# Patient Record
Sex: Male | Born: 1975
Health system: Southern US, Community
[De-identification: ages and names within clinical notes are randomized; demographics above are authoritative.]

## PROBLEM LIST (undated history)

## (undated) DIAGNOSIS — R519 Headache, unspecified: Secondary | ICD-10-CM

## (undated) DIAGNOSIS — R030 Elevated blood-pressure reading, without diagnosis of hypertension: Secondary | ICD-10-CM

## (undated) DIAGNOSIS — A159 Respiratory tuberculosis unspecified: Secondary | ICD-10-CM

## (undated) DIAGNOSIS — N476 Balanoposthitis: Secondary | ICD-10-CM

## (undated) DIAGNOSIS — I8390 Asymptomatic varicose veins of unspecified lower extremity: Secondary | ICD-10-CM

## (undated) DIAGNOSIS — M542 Cervicalgia: Secondary | ICD-10-CM

## (undated) DIAGNOSIS — K8020 Calculus of gallbladder without cholecystitis without obstruction: Secondary | ICD-10-CM

## (undated) DIAGNOSIS — M722 Plantar fascial fibromatosis: Secondary | ICD-10-CM

## (undated) DIAGNOSIS — M545 Low back pain, unspecified: Secondary | ICD-10-CM

## (undated) DIAGNOSIS — K602 Anal fissure, unspecified: Secondary | ICD-10-CM

## (undated) DIAGNOSIS — R229 Localized swelling, mass and lump, unspecified: Secondary | ICD-10-CM

## (undated) DIAGNOSIS — F4541 Pain disorder exclusively related to psychological factors: Secondary | ICD-10-CM

## (undated) DIAGNOSIS — R0609 Other forms of dyspnea: Secondary | ICD-10-CM

## (undated) DIAGNOSIS — G8929 Other chronic pain: Secondary | ICD-10-CM

## (undated) DIAGNOSIS — R51 Headache: Secondary | ICD-10-CM

## (undated) DIAGNOSIS — K219 Gastro-esophageal reflux disease without esophagitis: Secondary | ICD-10-CM

## (undated) DIAGNOSIS — S139XXA Sprain of joints and ligaments of unspecified parts of neck, initial encounter: Secondary | ICD-10-CM

## (undated) DIAGNOSIS — E669 Obesity, unspecified: Secondary | ICD-10-CM

## (undated) DIAGNOSIS — D649 Anemia, unspecified: Secondary | ICD-10-CM

## (undated) DIAGNOSIS — R0989 Other specified symptoms and signs involving the circulatory and respiratory systems: Secondary | ICD-10-CM

## (undated) DIAGNOSIS — R7989 Other specified abnormal findings of blood chemistry: Secondary | ICD-10-CM

## (undated) DIAGNOSIS — L210 Seborrhea capitis: Secondary | ICD-10-CM

## (undated) DIAGNOSIS — M1990 Unspecified osteoarthritis, unspecified site: Secondary | ICD-10-CM

## (undated) DIAGNOSIS — G4730 Sleep apnea, unspecified: Secondary | ICD-10-CM

## (undated) HISTORY — DX: Asymptomatic varicose veins of unspecified lower extremity: I83.90

## (undated) HISTORY — DX: Cervicalgia: M54.2

## (undated) HISTORY — DX: Other chronic pain: G89.29

## (undated) HISTORY — DX: Seborrhea capitis: L21.0

## (undated) HISTORY — DX: Sleep apnea, unspecified: G47.30

## (undated) HISTORY — DX: Localized swelling, mass and lump, unspecified: R22.9

## (undated) HISTORY — DX: Respiratory tuberculosis unspecified: A15.9

## (undated) HISTORY — DX: Other forms of dyspnea: R06.09

## (undated) HISTORY — DX: Anal fissure, unspecified: K60.2

## (undated) HISTORY — DX: Headache: R51

## (undated) HISTORY — DX: Low back pain: M54.5

## (undated) HISTORY — DX: Other specified symptoms and signs involving the circulatory and respiratory systems: R09.89

## (undated) HISTORY — DX: Elevated blood-pressure reading, without diagnosis of hypertension: R03.0

## (undated) HISTORY — DX: Other specified abnormal findings of blood chemistry: R79.89

## (undated) HISTORY — DX: Anemia, unspecified: D64.9

## (undated) HISTORY — DX: Obesity, unspecified: E66.9

## (undated) HISTORY — DX: Plantar fascial fibromatosis: M72.2

## (undated) HISTORY — DX: Unspecified osteoarthritis, unspecified site: M19.90

## (undated) HISTORY — DX: Gastro-esophageal reflux disease without esophagitis: K21.9

## (undated) HISTORY — DX: Sprain of joints and ligaments of unspecified parts of neck, initial encounter: S13.9XXA

## (undated) HISTORY — DX: Balanoposthitis: N47.6

## (undated) HISTORY — DX: Calculus of gallbladder without cholecystitis without obstruction: K80.20

## (undated) HISTORY — DX: Pain disorder exclusively related to psychological factors: F45.41

## (undated) HISTORY — DX: Headache, unspecified: R51.9

## (undated) HISTORY — DX: Low back pain, unspecified: M54.50

---

## 2009-06-23 HISTORY — PX: OTHER SURGICAL HISTORY: SHX169

## 2009-06-23 HISTORY — PX: HEMORRHOIDECTOMY WITH HEMORRHOID BANDING: SHX5633

## 2009-09-11 ENCOUNTER — Ambulatory Visit: Payer: Self-pay | Admitting: Family Medicine

## 2009-10-17 HISTORY — PX: GASTRIC BYPASS: SHX52

## 2010-02-07 ENCOUNTER — Ambulatory Visit: Payer: Self-pay | Admitting: Gastroenterology

## 2010-02-07 LAB — HM COLONOSCOPY: HM Colonoscopy: NORMAL

## 2012-05-11 ENCOUNTER — Encounter: Payer: Self-pay | Admitting: *Deleted

## 2012-05-18 ENCOUNTER — Ambulatory Visit: Payer: Self-pay | Admitting: Family Medicine

## 2012-05-26 ENCOUNTER — Ambulatory Visit: Payer: Self-pay | Admitting: Family Medicine

## 2012-09-23 ENCOUNTER — Ambulatory Visit (INDEPENDENT_AMBULATORY_CARE_PROVIDER_SITE_OTHER): Payer: 59 | Admitting: Internal Medicine

## 2012-09-23 ENCOUNTER — Encounter: Payer: Self-pay | Admitting: Internal Medicine

## 2012-09-23 VITALS — BP 118/68 | HR 71 | Temp 98.5°F | Resp 16 | Ht 76.0 in | Wt 247.5 lb

## 2012-09-23 DIAGNOSIS — R079 Chest pain, unspecified: Secondary | ICD-10-CM

## 2012-09-23 DIAGNOSIS — Z9884 Bariatric surgery status: Secondary | ICD-10-CM

## 2012-09-23 DIAGNOSIS — E785 Hyperlipidemia, unspecified: Secondary | ICD-10-CM

## 2012-09-23 DIAGNOSIS — E669 Obesity, unspecified: Secondary | ICD-10-CM

## 2012-09-23 DIAGNOSIS — R5381 Other malaise: Secondary | ICD-10-CM

## 2012-09-23 DIAGNOSIS — G8929 Other chronic pain: Secondary | ICD-10-CM

## 2012-09-23 DIAGNOSIS — R5383 Other fatigue: Secondary | ICD-10-CM

## 2012-09-23 DIAGNOSIS — R1013 Epigastric pain: Secondary | ICD-10-CM

## 2012-09-23 MED ORDER — ESOMEPRAZOLE MAGNESIUM 40 MG PO CPDR: 40.0000 mg | DELAYED_RELEASE_CAPSULE | Freq: Every day | ORAL | Status: DC

## 2012-09-23 NOTE — Progress Notes (Signed)
Patient ID: Chase Carey, male   DOB: 04-20-1976, 37 y.o.   MRN: [id number]            Patient Active Problem List  Diagnosis  . Chest pain, unspecified  . Obesity, unspecified    Subjective:  CC:   Chief Complaint  Patient presents with  . Establish Care    HPI:   Chase Carey is a 37 y.o. male who presents as a new patient to establish primary care with the chief complaint of  Recurrent  episode of morning substernal chest pain that spreadsa to abdoemn and frequently radiates around his sides to his back. The pain is sharp and feels like bad gas cramps.  It lasts for a full day, does not wax and wane and is typically  Resolved by next morning,  Has diminished appetite that day,  Pain is somehwat relieved by leaning back in chair but returns if he gets up.  The first occurrence was 1.5 yrs ago. He had gastric bypass in 2011 for morbid obesity at 389lbs.  He lost 150 lbs but has gained 11 lbs back. Symptoms are different than the dumping syndrome symptoms that he has expereinced if he overindulges in carbohydrates. .     Prior evaluation for pain complaint by a provider in [facility] was brief, did not include a GI or cardiac evaluation  and concluded/attributed pain to arthritis .   His CRF's include  very rare cigar smoking . FH unknown bc he is adopted. .  Lipid status unknown ,  Last checked 2 years ago.   He has occasional Leg cramps with driving,  And nocturnally, but not with exercise,   Prior to his gastric bypass he was tested and found to have mild sleep apnea.  His history of persistent knee and ankle joint pain havenow resolved with weight loss.   Rare episodes of back pain brought on by bending over.  Not more than twice a year. Stabbing pain .  Lasts  Usually a day or two,  No otc NSAIDs have helped rare  [place]PD in [address] treated with 6 months of INH     Past Medical History  Diagnosis Date  . Obesity, unspecified   . Anal fissure   . Asymptomatic varicose  veins   . Psychogenic pain, site unspecified   . Elevated blood pressure reading without diagnosis of hypertension   . Plantar fascial fibromatosis   . Seborrhea capitis   . Other dyspnea and respiratory abnormality   . Sprain of neck   . Cervicalgia   . Balanoposthitis   . Anemia, unspecified   . Other abnormal blood chemistry   . Lumbago   . Localized superficial swelling, mass, or lump   . Sleep apnea     mild    Past Surgical History  Procedure Laterality Date  . Gastric bypass  10/17/2009  . Rle varicose vein laser procedure  2011    [facility]  . Hemorrhoidectomy with hemorrhoid banding  2011    Family History  Problem Relation Age of Onset  . Adopted: Yes    History   Social History  . Marital Status: Married    Spouse Name: N/A    Number of Children: 2  . Years of Education: college   Occupational History  . [employer]    Social History Main Topics  . Smoking status: Never Smoker   . Smokeless tobacco: Not on file     Comment: smokes one cigar  per year  . Alcohol Use: Yes     Comment: occasional  1 - 2 times per year wine  . Drug Use: No  . Sexually Active: Not on file   Other Topics Concern  . Not on file   Social History Narrative   Unknown family history. Adopted. Married x 10.5 years, happily married. Caffeine use: Coffee, 2 servings per day. Guns in the home stored in locked cabinet, loaded. No formal exercise program.       @ALLHX @    Review of Systems:   The remainder of the review of systems was negative except those addressed in the HPI.       Objective:  BP 118/68  Pulse 71  Temp(Src) 98.5 F (36.9 C) (Oral)  Resp 16  Ht 6\' 4"  (1.93 m)  Wt 247 lb 8 oz (112.265 kg)  BMI 30.14 kg/m2  SpO2 98%  General appearance: alert, cooperative and appears stated age Ears: normal TM's and external ear canals both ears Throat: lips, mucosa, and tongue normal; teeth and gums normal Neck: no adenopathy, no carotid bruit,  supple, symmetrical, trachea midline and thyroid not enlarged, symmetric, no tenderness/mass/nodules Back: symmetric, no curvature. ROM normal. No CVA tenderness. Lungs: clear to auscultation bilaterally Heart: regular rate and rhythm, S1, S2 normal, no murmur, click, rub or gallop Abdomen: soft, non-tender; bowel sounds normal; no masses,  no organomegaly Pulses: 2+ and symmetric Skin: Skin color, texture, turgor normal. No rashes or lesions Lymph nodes: Cervical, supraclavicular, and axillary nodes normal.  Assessment and Plan:  Chest pain, unspecified His chest pain is atypical for cardiac causes and suggest more a GI etiology. However his cardiac risk factors are unknown at this time due to his adoptive status and unknown lipid status. His EKG is relatively normal with no signs of LVH or prior infarct. I have suggested that we try empiric treatment for esophageal reflux/gastritis with a a daily proton pump inhibitor and have him return during an episode of pain so that we can check an EKG and a lipase at the time the symptoms recur. He will also return for fasting lipids.  Obesity, unspecified Patient maintains his weight currently however he has gained back 11 pounds since his surgery. Low carbohydrate diet and exercise recommended. Continue supplements.   Updated Medication List Outpatient Encounter Prescriptions as of 09/23/2012  Medication Sig Dispense Refill  . calcium carbonate (OS-CAL) 600 MG TABS Take 600 mg by mouth 2 (two) times daily with a meal.      . cholecalciferol (VITAMIN D) 1000 UNITS tablet Take 3,000 Units by mouth daily.       . clobetasol (OLUX) 0.05 % topical foam Apply topically 2 (two) times daily.      . Multiple Vitamin (MULTIVITAMINS PO) Take by mouth daily.      . vitamin B-12 (CYANOCOBALAMIN) 100 MCG tablet Take 100 mcg by mouth daily.      Marland Kitchen EPINEPHrine (EPIPEN 2-PAK) 0.3 mg/0.3 mL DEVI Inject 0.3 mg into the muscle once.      Marland Kitchen esomeprazole (NEXIUM) 40 MG  capsule Take 1 capsule (40 mg total) by mouth daily.  30 capsule  3  . nystatin cream (MYCOSTATIN) Apply topically 2 (two) times daily.       No facility-administered encounter medications on file as of 09/23/2012.     Orders Placed This Encounter  Procedures  . COMPLETE METABOLIC PANEL WITH GFR  . CBC with Differential  . Vitamin B12  . Magnesium  . Lipid  panel  . Hemoglobin A1c  . Lipase  . EKG 12-Lead    No Follow-up on file.

## 2012-09-23 NOTE — Patient Instructions (Addendum)
I think your epiosdes of  abdominal and chest pain may be coming from severe reflux,  Gastritis or pancreatitis  I am starting you on a proton pump inhibitor to see if it helps.  Take it in the am 30 minutes prior to eating.  Please request records from whatever doctor did your preoperative evaluation with EKG.  Please call us if you have another episode so we can see you same day  Return  Tomorrow for fasting bloodwork (only black coffee or  water )

## 2012-09-24 ENCOUNTER — Other Ambulatory Visit (INDEPENDENT_AMBULATORY_CARE_PROVIDER_SITE_OTHER): Payer: 59

## 2012-09-24 DIAGNOSIS — G8929 Other chronic pain: Secondary | ICD-10-CM

## 2012-09-24 DIAGNOSIS — E663 Overweight: Secondary | ICD-10-CM | POA: Insufficient documentation

## 2012-09-24 DIAGNOSIS — R1013 Epigastric pain: Secondary | ICD-10-CM

## 2012-09-24 DIAGNOSIS — R079 Chest pain, unspecified: Secondary | ICD-10-CM | POA: Insufficient documentation

## 2012-09-24 DIAGNOSIS — E669 Obesity, unspecified: Secondary | ICD-10-CM | POA: Insufficient documentation

## 2012-09-24 DIAGNOSIS — Z9884 Bariatric surgery status: Secondary | ICD-10-CM

## 2012-09-24 LAB — LIPID PANEL
Cholesterol: 172 mg/dL (ref 0–200)
HDL: 56.1 mg/dL (ref >39.00)
LDL Cholesterol: 106 mg/dL — ABNORMAL HIGH (ref 0–99)
Triglycerides: 48 mg/dL (ref 0.0–149.0)
VLDL: 9.6 mg/dL (ref 0.0–40.0)

## 2012-09-24 LAB — CBC WITH DIFFERENTIAL/PLATELET
Basophils Relative: 0.7 % (ref 0.0–3.0)
Eosinophils Absolute: 0.1 10*3/uL (ref 0.0–0.7)
Eosinophils Relative: 2.6 % (ref 0.0–5.0)
Hemoglobin: 12.4 g/dL — ABNORMAL LOW (ref 13.0–17.0)
Lymphocytes Relative: 27.5 % (ref 12.0–46.0)
MCHC: 33 g/dL (ref 30.0–36.0)
Monocytes Relative: 9 % (ref 3.0–12.0)
Neutro Abs: 3 10*3/uL (ref 1.4–7.7)
Neutrophils Relative %: 60.2 % (ref 43.0–77.0)
RBC: 3.85 Mil/uL — ABNORMAL LOW (ref 4.22–5.81)
WBC: 4.9 10*3/uL (ref 4.5–10.5)

## 2012-09-24 LAB — MAGNESIUM: Magnesium: 1.8 mg/dL (ref 1.5–2.5)

## 2012-09-24 LAB — LIPASE: Lipase: 22 U/L (ref 11.0–59.0)

## 2012-09-24 NOTE — Assessment & Plan Note (Signed)
Patient maintains his weight currently however he has gained back 11 pounds since his surgery. Low carbohydrate diet and exercise recommended. Continue supplements.

## 2012-09-24 NOTE — Assessment & Plan Note (Addendum)
His chest pain is atypical for cardiac causes and suggest more a GI etiology. However his cardiac risk factors are unknown at this time due to his adoptive status and unknown lipid status. His EKG is relatively normal with no signs of LVH or prior infarct. I have suggested that we try empiric treatment for esophageal reflux/gastritis with a a daily proton pump inhibitor and have him return during an episode of pain so that we can check an EKG and a lipase at the time the symptoms recur. He will also return for fasting lipids.

## 2012-09-25 LAB — COMPLETE METABOLIC PANEL WITH GFR
ALT: 13 U/L (ref 0–53)
BUN: 14 mg/dL (ref 6–23)
CO2: 29 mEq/L (ref 19–32)
Calcium: 9.2 mg/dL (ref 8.4–10.5)
Creat: 0.9 mg/dL (ref 0.50–1.35)
GFR, Est African American: >89 mL/min
Total Bilirubin: 1 mg/dL (ref 0.3–1.2)

## 2012-09-27 ENCOUNTER — Encounter: Payer: Self-pay | Admitting: General Practice

## 2012-09-27 NOTE — Progress Notes (Signed)
Letter mailed to pt.  

## 2012-09-29 ENCOUNTER — Telehealth: Payer: Self-pay | Admitting: Internal Medicine

## 2012-09-29 MED ORDER — ESOMEPRAZOLE MAGNESIUM 40 MG PO CPDR: 40.0000 mg | DELAYED_RELEASE_CAPSULE | Freq: Every day | ORAL | Status: DC

## 2012-09-29 NOTE — Telephone Encounter (Signed)
Left message on patients voicemail to call office , RX sent to pharmacy.

## 2012-09-29 NOTE — Telephone Encounter (Signed)
Patient wanting his   (NEXIUM) 40 MG capsule  Prescription sent to [facility] [address].

## 2012-10-29 ENCOUNTER — Ambulatory Visit (INDEPENDENT_AMBULATORY_CARE_PROVIDER_SITE_OTHER): Payer: 59 | Admitting: Internal Medicine

## 2012-10-29 ENCOUNTER — Encounter: Payer: Self-pay | Admitting: Internal Medicine

## 2012-10-29 VITALS — BP 146/76 | HR 79 | Temp 98.0°F | Resp 16 | Wt 250.5 lb

## 2012-10-29 DIAGNOSIS — K219 Gastro-esophageal reflux disease without esophagitis: Secondary | ICD-10-CM | POA: Insufficient documentation

## 2012-10-29 DIAGNOSIS — Z9852 Vasectomy status: Secondary | ICD-10-CM | POA: Insufficient documentation

## 2012-10-29 DIAGNOSIS — R079 Chest pain, unspecified: Secondary | ICD-10-CM

## 2012-10-29 DIAGNOSIS — E669 Obesity, unspecified: Secondary | ICD-10-CM

## 2012-10-29 DIAGNOSIS — Z3009 Encounter for other general counseling and advice on contraception: Secondary | ICD-10-CM

## 2012-10-29 NOTE — Progress Notes (Signed)
Patient ID: Chase Carey, male   DOB: 19-Jul-1975, 37 y.o.   MRN: [id number]  Patient Active Problem List   Diagnosis Date Noted  . Esophageal reflux 10/29/2012  . Encounter for vasectomy counseling 10/29/2012  . Chest pain, unspecified 09/24/2012  . Obesity, unspecified 09/24/2012    Subjective:  CC:   Chief Complaint  Patient presents with  . Follow-up    vasectomy to  consult    HPI:   Chase Carey a 37 y.o. male who presents  For follow up on recurrent  atypical substernal chest pain reported at first visit one month ago.  And to discuss referral for vasectomy.   His personal risk factors were assessed for early CAD and were minimal but his FH is unknown due to adopted status.  He was empirically treated for reflux prescribed one month ago., And the pain has only recurred once and not nearly as bad .  The pain started at work in the xiphoid area , was heavy and sharp, and spread to enitre abdomen and back before resolving spontaneously .   Past Medical History  Diagnosis Date  . Obesity, unspecified   . Anal fissure   . Asymptomatic varicose veins   . Psychogenic pain, site unspecified   . Elevated blood pressure reading without diagnosis of hypertension   . Plantar fascial fibromatosis   . Seborrhea capitis   . Other dyspnea and respiratory abnormality   . Sprain of neck   . Cervicalgia   . Balanoposthitis   . Anemia, unspecified   . Other abnormal blood chemistry   . Lumbago   . Localized superficial swelling, mass, or lump   . Sleep apnea     mild    Past Surgical History  Procedure Laterality Date  . Gastric bypass  10/17/2009  . Rle varicose vein laser procedure  2011    [facility]  . Hemorrhoidectomy with hemorrhoid banding  2011       The following portions of the patient's history were reviewed and updated as appropriate: Allergies, current medications, and problem list.    Review of Systems:  Patient denies headache, fevers, malaise,  unintentional weight loss, skin rash, eye pain, sinus congestion and sinus pain, sore throat, dysphagia,  hemoptysis , cough, dyspnea, wheezing, chest pain, palpitations, orthopnea, edema, abdominal pain, nausea, melena, diarrhea, constipation, flank pain, dysuria, hematuria, urinary  Frequency, nocturia, numbness, tingling, seizures,  Focal weakness, Loss of consciousness,  Tremor, insomnia, depression, anxiety, and suicidal ideation.     History   Social History  . Marital Status: Married    Spouse Name: N/A    Number of Children: 2  . Years of Education: college   Occupational History  . [employer]    Social History Main Topics  . Smoking status: Never Smoker   . Smokeless tobacco: Not on file     Comment: smokes one cigar per year  . Alcohol Use: Yes     Comment: occasional  1 - 2 times per year wine  . Drug Use: No  . Sexually Active: Not on file   Other Topics Concern  . Not on file   Social History Narrative   Unknown family history. Adopted. Married x 10.5 years, happily married. Caffeine use: Coffee, 2 servings per day. Guns in the home stored in locked cabinet, loaded. No formal exercise program.    Objective:  BP 146/76  Pulse 79  Temp(Src) 98 F (36.7 C) (Oral)  Resp 16  Wt 250 lb  8 oz (113.626 kg)  BMI 30.5 kg/m2  SpO2 99%  General appearance: alert, cooperative and appears stated age Ears: normal TM's and external ear canals both ears Throat: lips, mucosa, and tongue normal; teeth and gums normal Neck: no adenopathy, no carotid bruit, supple, symmetrical, trachea midline and thyroid not enlarged, symmetric, no tenderness/mass/nodules Back: symmetric, no curvature. ROM normal. No CVA tenderness. Lungs: clear to auscultation bilaterally Heart: regular rate and rhythm, S1, S2 normal, no murmur, click, rub or gallop Abdomen: soft, non-tender; bowel sounds normal; no masses,  no organomegaly Pulses: 2+ and symmetric Skin: Skin color, texture, turgor  normal. No rashes or lesions Lymph nodes: Cervical, supraclavicular, and axillary nodes normal.  Assessment and Plan:  Esophageal reflux Presumed,  Improved with use of nexium  Chest pain, unspecified Atypical ,  Occurring at rest, with no known modifiable risk factors.  Continue PPI.   Obesity, unspecified He has gained a few lbs back after losing 150 lbs.  I have addressed  BMI and recommended a low glycemic index diet utilizing smaller more frequent meals to increase metabolism.  I have also recommended that patient start exercising with a goal of 30 minutes of aerobic exercise a minimum of 5 days per week. Screening for lipid disorders, thyroid and diabetes to be done today.    Encounter for vasectomy counseling He is requesting vasectomy for permanent contraception .  Referral to Urology   Updated Medication List Outpatient Encounter Prescriptions as of 10/29/2012  Medication Sig Dispense Refill  . calcium carbonate (OS-CAL) 600 MG TABS Take 600 mg by mouth 2 (two) times daily with a meal.      . cholecalciferol (VITAMIN D) 1000 UNITS tablet Take 3,000 Units by mouth daily.       . clobetasol (OLUX) 0.05 % topical foam Apply topically 2 (two) times daily.      Marland Kitchen EPINEPHrine (EPIPEN 2-PAK) 0.3 mg/0.3 mL DEVI Inject 0.3 mg into the muscle once.      Marland Kitchen esomeprazole (NEXIUM) 40 MG capsule Take 1 capsule (40 mg total) by mouth daily.  30 capsule  3  . Multiple Vitamin (MULTIVITAMINS PO) Take by mouth daily.      Marland Kitchen nystatin cream (MYCOSTATIN) Apply topically 2 (two) times daily.      . vitamin B-12 (CYANOCOBALAMIN) 100 MCG tablet Take 100 mcg by mouth daily.       No facility-administered encounter medications on file as of 10/29/2012.     Orders Placed This Encounter  Procedures  . Ambulatory referral to Urology    No Follow-up on file.

## 2012-10-29 NOTE — Assessment & Plan Note (Signed)
Presumed,  Improved with use of nexium

## 2012-10-31 ENCOUNTER — Encounter: Payer: Self-pay | Admitting: Internal Medicine

## 2012-10-31 NOTE — Assessment & Plan Note (Signed)
He is requesting vasectomy for permanent contraception .  Referral to Urology

## 2012-10-31 NOTE — Assessment & Plan Note (Signed)
Atypical ,  Occurring at rest, with no known modifiable risk factors.  Continue PPI.

## 2012-10-31 NOTE — Assessment & Plan Note (Signed)
He has gained a few lbs back after losing 150 lbs.  I have addressed  BMI and recommended a low glycemic index diet utilizing smaller more frequent meals to increase metabolism.  I have also recommended that patient start exercising with a goal of 30 minutes of aerobic exercise a minimum of 5 days per week. Screening for lipid disorders, thyroid and diabetes to be done today.

## 2013-01-10 ENCOUNTER — Telehealth: Payer: Self-pay | Admitting: Internal Medicine

## 2013-01-10 DIAGNOSIS — I83893 Varicose veins of bilateral lower extremities with other complications: Secondary | ICD-10-CM

## 2013-01-10 NOTE — Telephone Encounter (Signed)
Referral is in process as requested 

## 2013-01-10 NOTE — Telephone Encounter (Signed)
Patient verified that bleeding has stopped and that he has small scratch or puncture where bleeding occurred. Please advise.

## 2013-01-10 NOTE — Telephone Encounter (Signed)
Pt asking for referral to vein specialist for his varicose veins.  Pt states on Thursday he noticed bleeding while in the shower that lasted approximately 2 minutes.

## 2013-02-07 ENCOUNTER — Encounter: Payer: Self-pay | Admitting: Internal Medicine

## 2013-02-25 HISTORY — PX: VASECTOMY: SHX75

## 2013-02-28 ENCOUNTER — Encounter: Payer: 59 | Admitting: Internal Medicine

## 2013-04-07 HISTORY — PX: VEIN LIGATION AND STRIPPING: SHX2653

## 2013-04-11 ENCOUNTER — Ambulatory Visit (INDEPENDENT_AMBULATORY_CARE_PROVIDER_SITE_OTHER): Payer: 59 | Admitting: Internal Medicine

## 2013-04-11 ENCOUNTER — Encounter: Payer: Self-pay | Admitting: Internal Medicine

## 2013-04-11 VITALS — BP 118/72 | HR 89 | Temp 97.9°F | Resp 14 | Ht 76.0 in | Wt 243.5 lb

## 2013-04-11 DIAGNOSIS — Z9884 Bariatric surgery status: Secondary | ICD-10-CM

## 2013-04-11 DIAGNOSIS — Z3009 Encounter for other general counseling and advice on contraception: Secondary | ICD-10-CM

## 2013-04-11 DIAGNOSIS — E669 Obesity, unspecified: Secondary | ICD-10-CM

## 2013-04-11 DIAGNOSIS — K8020 Calculus of gallbladder without cholecystitis without obstruction: Secondary | ICD-10-CM

## 2013-04-11 DIAGNOSIS — R1011 Right upper quadrant pain: Secondary | ICD-10-CM

## 2013-04-11 DIAGNOSIS — Z0000 Encounter for general adult medical examination without abnormal findings: Secondary | ICD-10-CM

## 2013-04-11 DIAGNOSIS — R1013 Epigastric pain: Secondary | ICD-10-CM

## 2013-04-11 DIAGNOSIS — I8393 Asymptomatic varicose veins of bilateral lower extremities: Secondary | ICD-10-CM | POA: Insufficient documentation

## 2013-04-11 DIAGNOSIS — I83819 Varicose veins of unspecified lower extremities with pain: Secondary | ICD-10-CM | POA: Insufficient documentation

## 2013-04-11 DIAGNOSIS — I83893 Varicose veins of bilateral lower extremities with other complications: Secondary | ICD-10-CM

## 2013-04-11 NOTE — Patient Instructions (Signed)
I am ordering an ultrasound of your gallbladder to check for gallstones.  The bloodwork today will include some vitamins known to drop after bariatric syrgery

## 2013-04-11 NOTE — Assessment & Plan Note (Signed)
Done Sept 5th by Dr Isabel Caprice.  First check was zero.  tenderness resolved.

## 2013-04-12 ENCOUNTER — Encounter: Payer: Self-pay | Admitting: Internal Medicine

## 2013-04-12 ENCOUNTER — Ambulatory Visit: Payer: Self-pay | Admitting: Internal Medicine

## 2013-04-12 DIAGNOSIS — Z0000 Encounter for general adult medical examination without abnormal findings: Secondary | ICD-10-CM | POA: Insufficient documentation

## 2013-04-12 LAB — LIPASE: Lipase: 33 U/L (ref 11.0–59.0)

## 2013-04-12 LAB — COMPREHENSIVE METABOLIC PANEL
Alkaline Phosphatase: 55 U/L (ref 39–117)
BUN: 18 mg/dL (ref 6–23)
Glucose, Bld: 77 mg/dL (ref 70–99)
Total Bilirubin: 0.9 mg/dL (ref 0.3–1.2)

## 2013-04-12 NOTE — Assessment & Plan Note (Signed)
His weight has continued to decline slowly since his bariatric surgery in 2011.  Labs today. Low GI diet and regular exercise encouraged.

## 2013-04-12 NOTE — Progress Notes (Signed)
Patient ID: Chase Carey, male   DOB: 08-24-1975, 37 y.o.   MRN: [id number]  The patient is here for his annual male physical examination and management of other chronic and acute problems.  He underwent elective vasectomy in August ,  With no complications.  His inguinal tenderness resolved about a week ago.    The risk factors are reflected in the social history.  The roster of all physicians providing medical care to patient - is listed in the Snapshot section of the chart.  Activities of daily living:  The patient is 100% independent in all ADLs: dressing, toileting, feeding as well as independent mobility  Home safety : The patient has smoke detectors in the home. He wears seatbelts.  There are no firearms at home. There is no violence in the home.   There is no risks for hepatitis, STDs or HIV. There is no   history of blood transfusion. There is no travel history to infectious disease endemic areas of the world.  The patient has seen their dentist in the last six month and  their eye doctor in the last year.  They do not  have excessive sun exposure. They have seen a dermatoloigist in the last year. Discussed the need for sun protection: hats, long sleeves and use of sunscreen if there is significant sun exposure.   Diet: the importance of a healthy diet is discussed. They do have a healthy diet.  The benefits of regular aerobic exercise were discussed. He exercises a minimum of 30 minutes  5 days per week. Depression screen: there are no signs or vegative symptoms of depression- irritability, change in appetite, anhedonia, sadness/tearfullness.  The following portions of the patient's history were reviewed and updated as appropriate: allergies, current medications, past family history, past medical history,  past surgical history, past social history  and problem list.  Visual acuity was not assessed per patient preference since he has regular follow up with his ophthalmologist. Hearing  and body mass index were assessed and reviewed.   During the course of the visit the patient was educated and counseled about appropriate screening and preventive services including :  nutrition counseling, colorectal cancer screening, and recommended immunizations.   Objective:  BP 118/72  Pulse 89  Temp(Src) 97.9 F (36.6 C) (Oral)  Resp 14  Ht 6\' 4"  (1.93 m)  Wt 243 lb 8 oz (110.451 kg)  BMI 29.65 kg/m2  SpO2 99%  General Appearance:    Alert, cooperative, no distress, appears stated age  Head:    Normocephalic, without obvious abnormality, atraumatic  Eyes:    PERRL, conjunctiva/corneas clear, EOM's intact, fundi    benign, both eyes       Ears:    Normal TM's and external ear canals, both ears  Nose:   Nares normal, septum midline, mucosa normal, no drainage   or sinus tenderness  Throat:   Lips, mucosa, and tongue normal; teeth and gums normal  Neck:   Supple, symmetrical, trachea midline, no adenopathy;       thyroid:  No enlargement/tenderness/nodules; no carotid   bruit or JVD  Back:     Symmetric, no curvature, ROM normal, no CVA tenderness  Lungs:     Clear to auscultation bilaterally, respirations unlabored  Chest wall:    No tenderness or deformity  Heart:    Regular rate and rhythm, S1 and S2 normal, no murmur, rub   or gallop  Abdomen:     Soft, non-tender, bowel sounds active  all four quadrants,    no masses, no organomegaly  Genitalia:    Normal male without lesion, discharge or tenderness  Rectal:    Normal tone, normal prostate, no masses or tenderness;   guaiac negative stool  Extremities:   Extremities normal, atraumatic, no cyanosis or edema  Pulses:   2+ and symmetric all extremities  Skin:   Skin color, texture, turgor normal, no rashes or lesions  Lymph nodes:   Cervical, supraclavicular, and axillary nodes normal  Neurologic:   CNII-XII intact. Normal strength, sensation and reflexes      throughout   Assessment and Plan:  Encounter for  vasectomy counseling Done Sept 5th by Dr Isabel Caprice.  First check was zero.  tenderness resolved.   Varicose veins of lower extremities with other complications Right leg s/[p ablation by [facility] last Friday.,  Some pain but no redness.    Wearing his Ted hose.    Obesity, unspecified His weight has continued to decline slowly since his bariatric surgery in 2011.  Labs today. Low GI diet and regular exercise encouraged.   Routine general medical examination at a health care facility Annual physical exam was done excluding testicular exam since he has vasectomy 8 weeks ago.  Screening for STDS and depressive symptoms was negative. Vaccines were brought up to date.  Healthy habits were reviewed including use of seatbelts 100% of the time , moderate alcohol consumption, regular exercise, and the  mediterranean diet.    Updated Medication List Outpatient Encounter Prescriptions as of 04/11/2013  Medication Sig Dispense Refill  . calcium carbonate (OS-CAL) 600 MG TABS Take 600 mg by mouth 2 (two) times daily with a meal.      . cholecalciferol (VITAMIN D) 1000 UNITS tablet Take 3,000 Units by mouth daily.       . clobetasol (OLUX) 0.05 % topical foam Apply topically 2 (two) times daily.      Marland Kitchen EPINEPHrine (EPIPEN 2-PAK) 0.3 mg/0.3 mL DEVI Inject 0.3 mg into the muscle once.      Marland Kitchen esomeprazole (NEXIUM) 40 MG capsule Take 1 capsule (40 mg total) by mouth daily.  30 capsule  3  . Multiple Vitamin (MULTIVITAMINS PO) Take by mouth daily.      . vitamin B-12 (CYANOCOBALAMIN) 100 MCG tablet Take 100 mcg by mouth daily.      Marland Kitchen nystatin cream (MYCOSTATIN) Apply topically 2 (two) times daily.       No facility-administered encounter medications on file as of 04/11/2013.

## 2013-04-12 NOTE — Assessment & Plan Note (Signed)
Right leg s/[p ablation by [facility] last Friday.,  Some pain but no redness.    Wearing his Ted hose.

## 2013-04-12 NOTE — Assessment & Plan Note (Signed)
Annual physical exam was done excluding testicular exam since he has vasectomy 8 weeks ago.  Screening for STDS and depressive symptoms was negative. Vaccines were brought up to date.  Healthy habits were reviewed including use of seatbelts 100% of the time , moderate alcohol consumption, regular exercise, and the  mediterranean diet.

## 2013-04-14 NOTE — Addendum Note (Signed)
Addended by: Sherlene Shams on: 04/14/2013 01:36 PM   Modules accepted: Orders

## 2013-04-15 LAB — VITAMIN A: Vitamin A (Retinoic Acid): 48 ug/dL (ref 38–98)

## 2013-04-15 LAB — ZINC: Zinc: 53 ug/dL — ABNORMAL LOW (ref 60–130)

## 2013-04-15 LAB — VITAMIN E: Vitamin E (Alpha Tocopherol): 13.3 mg/L (ref 5.7–19.9)

## 2013-04-20 LAB — VITAMIN K1, SERUM

## 2013-04-22 ENCOUNTER — Ambulatory Visit: Payer: Self-pay | Admitting: Internal Medicine

## 2013-04-25 ENCOUNTER — Telehealth: Payer: Self-pay | Admitting: Internal Medicine

## 2013-04-25 DIAGNOSIS — R1013 Epigastric pain: Secondary | ICD-10-CM

## 2013-04-25 NOTE — Telephone Encounter (Signed)
His HIDA scan was normal, which means his gallbladder if sunctioning fine. Marland Kitchen  He will need to see a GI doctor for further evaluation of his abdominal pain ; does he have a preference?

## 2013-04-27 ENCOUNTER — Encounter: Payer: Self-pay | Admitting: Internal Medicine

## 2013-04-27 NOTE — Telephone Encounter (Signed)
Called and advised patient of results. Prefers a [facility] GI doctor. Advised Amber will call with an appointment after referral placed.

## 2013-05-03 ENCOUNTER — Encounter: Payer: Self-pay | Admitting: Internal Medicine

## 2013-05-03 ENCOUNTER — Ambulatory Visit: Payer: 59 | Admitting: Internal Medicine

## 2013-05-06 ENCOUNTER — Encounter: Payer: Self-pay | Admitting: Internal Medicine

## 2013-05-25 ENCOUNTER — Encounter: Payer: Self-pay | Admitting: Internal Medicine

## 2013-05-26 ENCOUNTER — Ambulatory Visit: Payer: 59 | Admitting: Internal Medicine

## 2013-07-14 ENCOUNTER — Encounter: Payer: Self-pay | Admitting: Internal Medicine

## 2013-07-18 ENCOUNTER — Ambulatory Visit (INDEPENDENT_AMBULATORY_CARE_PROVIDER_SITE_OTHER): Payer: 59 | Admitting: Internal Medicine

## 2013-07-18 ENCOUNTER — Encounter: Payer: Self-pay | Admitting: Internal Medicine

## 2013-07-18 VITALS — BP 110/70 | HR 60 | Ht 76.0 in | Wt 243.8 lb

## 2013-07-18 DIAGNOSIS — R142 Eructation: Secondary | ICD-10-CM

## 2013-07-18 DIAGNOSIS — K8020 Calculus of gallbladder without cholecystitis without obstruction: Secondary | ICD-10-CM

## 2013-07-18 DIAGNOSIS — R1013 Epigastric pain: Secondary | ICD-10-CM

## 2013-07-18 DIAGNOSIS — R140 Abdominal distension (gaseous): Secondary | ICD-10-CM

## 2013-07-18 DIAGNOSIS — R141 Gas pain: Secondary | ICD-10-CM

## 2013-07-18 DIAGNOSIS — R143 Flatulence: Secondary | ICD-10-CM

## 2013-07-18 MED ORDER — HYOSCYAMINE SULFATE 0.125 MG SL SUBL: 0.1250 mg | SUBLINGUAL_TABLET | SUBLINGUAL | Status: DC | PRN

## 2013-07-18 NOTE — Progress Notes (Signed)
Patient ID: Chase Carey, male   DOB: 01-05-1976, 38 y.o.   MRN: [id number] HPI: Chase Carey is a 38 yo male with PMH of mild sleep apnea, chronic headaches, gallstones, elevated blood pressure he was seen in consultation at the request of Dr. Darrick Huntsman to evaluate episodic upper abd pain.  He is here alone today.  The patient reports episodes of upper abdominal pain which radiates to his back over the last 3 years. He reports that this has been significant for the past 12 months, but he first noticed it about 3 years ago. He reports this is episodic in nature and when it occurs he reports it as a burning bilateral upper abdominal pain which radiates straight through to his back. He reports it feels like a "very bad gas pain". When it occurs symptoms tend to last for approximately 1 day and then resolve completely. He reports the pain usually starts in the morning, but is not present when he wakes up. He describes it times as a abdominal bloating which she thinks would improve with belching but it never seems to. He has taken Gas-X for the bloating which helps some.  He tried Nexium 40 mg daily for 2-3 months without benefit. Occasionally he does have nausea associated with the pain and very rarely vomiting. Frequency of episodes tend to be approximately once per week but he can go over a month between episodes. He does not think it worsens with eating in any way. No change in bowel movements or bowel habit. He reports a history of hemorrhoids and painless rectal bleeding but this is not a problem of late. He reports a colonoscopy in 2011 performed in [place] at [facility]. He reports this was normal except for hemorrhoids.  He did have an abdominal ultrasound and HIDA scan performed in [place] in the last 6 months.  He was told he has gallstones.  Past Medical History  Diagnosis Date  . Obesity, unspecified   . Anal fissure   . Asymptomatic varicose veins   . Psychogenic pain, site unspecified   . Elevated  blood pressure reading without diagnosis of hypertension   . Plantar fascial fibromatosis   . Seborrhea capitis   . Other dyspnea and respiratory abnormality   . Sprain of neck   . Cervicalgia   . Balanoposthitis   . Anemia, unspecified   . Other abnormal blood chemistry   . Lumbago   . Localized superficial swelling, mass, or lump   . Sleep apnea     mild  . Gallstones   . Chronic headaches     Past Surgical History  Procedure Laterality Date  . Gastric bypass  10/17/2009  . Rle varicose vein laser procedure  2011    [facility]  . Hemorrhoidectomy with hemorrhoid banding  2011  . Vasectomy  02/25/2013    no report of complications  . Vein ligation and stripping Right 04/07/13    leg    Current Outpatient Prescriptions  Medication Sig Dispense Refill  . calcium carbonate (OS-CAL) 600 MG TABS Take 600 mg by mouth 2 (two) times daily with a meal.      . cholecalciferol (VITAMIN D) 1000 UNITS tablet Take 3,000 Units by mouth daily.       . clobetasol (OLUX) 0.05 % topical foam Apply topically 2 (two) times daily.      Marland Kitchen EPINEPHrine (EPIPEN 2-PAK) 0.3 mg/0.3 mL DEVI Inject 0.3 mg into the muscle once.      . Multiple Vitamin (MULTIVITAMINS PO)  Take by mouth daily.      . Multiple Vitamins-Minerals (ZINC PO) Take 1 tablet by mouth daily.      Marland Kitchen. nystatin cream (MYCOSTATIN) Apply topically 2 (two) times daily.      . vitamin B-12 (CYANOCOBALAMIN) 100 MCG tablet Take 100 mcg by mouth daily.      . hyoscyamine (LEVSIN SL) 0.125 MG SL tablet Place 1 tablet (0.125 mg total) under the tongue every 4 (four) hours as needed.  30 tablet  0   No current facility-administered medications for this visit.    Allergies  Allergen Reactions  . Shrimp [Shellfish Allergy] Anaphylaxis    Shortness of breath.    Family History  Problem Relation Age of Onset  . Adopted: Yes  . Other      adopted    History  Substance Use Topics  . Smoking status: Never Smoker   . Smokeless tobacco: Never  Used     Comment: smokes one cigar per year  . Alcohol Use: No     Comment: occasional  1 - 2 times per year wine    ROS: As per history of present illness, otherwise negative  BP 110/70  Pulse 60  Ht 6\' 4"  (1.93 m)  Wt 243 lb 12.8 oz (110.587 kg)  BMI 29.69 kg/m2 Constitutional: Well-developed and well-nourished. No distress. HEENT: Normocephalic and atraumatic. Oropharynx is clear and moist. No oropharyngeal exudate. Conjunctivae are normal.  No scleral icterus. Neck: Neck supple. Trachea midline. Cardiovascular: Normal rate, regular rhythm and intact distal pulses. No M/R/G Pulmonary/chest: Effort normal and breath sounds normal. No wheezing, rales or rhonchi. Abdominal: Soft, nontender, nondistended. Bowel sounds active throughout.  Extremities: no clubbing, cyanosis, or edema Lymphadenopathy: No cervical adenopathy noted. Neurological: Alert and oriented to person place and time. Skin: Skin is warm and dry. No rashes noted. Psychiatric: Normal mood and affect. Behavior is normal.  RELEVANT LABS AND IMAGING: CBC    Component Value Date/Time   WBC 4.9 09/24/2012 0828   RBC 3.85* 09/24/2012 0828   HGB 12.4* 09/24/2012 0828   HCT 37.6* 09/24/2012 0828   PLT 185.0 09/24/2012 0828   MCV 97.7 09/24/2012 0828   MCHC 33.0 09/24/2012 0828   RDW 13.1 09/24/2012 0828   LYMPHSABS 1.4 09/24/2012 0828   MONOABS 0.4 09/24/2012 0828   EOSABS 0.1 09/24/2012 0828   BASOSABS 0.0 09/24/2012 0828    CMP     Component Value Date/Time   NA 138 04/11/2013 1557   K 4.1 04/11/2013 1557   CL 104 04/11/2013 1557   CO2 27 04/11/2013 1557   GLUCOSE 77 04/11/2013 1557   BUN 18 04/11/2013 1557   CREATININE 0.7 04/11/2013 1557   CREATININE 0.90 09/24/2012 0828   CALCIUM 9.1 04/11/2013 1557   PROT 7.1 04/11/2013 1557   ALBUMIN 3.9 04/11/2013 1557   AST 22 04/11/2013 1557   ALT 14 04/11/2013 1557   ALKPHOS 55 04/11/2013 1557   BILITOT 0.9 04/11/2013 1557    complete abdominal ultrasound  -- October 2014   Report reviewed; findings poor visualization of the pancreas, possible mild hepatomegaly, cholelithiasis without cholecystitis or biliary ductal dilatation   HIDA scan -- October 2014 -- normal    ASSESSMENT/PLAN: 38 yo male with PMH of mild sleep apnea, chronic headaches, gallstones, elevated blood pressure he was seen in consultation at the request of Dr. Darrick Huntsmanullo to evaluate episodic upper abd pain.  1.  Episodic upper abd pain/gas and bloating -- it is certainly possible that his  upper abdominal pain is secondary to symptomatic gallstones. He has very normal periods without abdominal complaint between these episodes. He did not respond to PPI therapy.  I have recommended upper endoscopy to exclude upper GI inflammation and ulcer disease. I will give him a prescription for Levsin to be used as needed and as directed for abdominal pain during these episodes.   I have also recommended a trial of a probiotic, such as [employer] or Align for gas and bloating. If his upper endoscopy is unremarkable, recommendation would be for surgical opinion regarding cholecystectomy.  Requesting colonoscopy report for review

## 2013-07-18 NOTE — Patient Instructions (Signed)
You have been scheduled for an endoscopy with propofol. Please follow written instructions given to you at your visit today. If you use inhalers (even only as needed), please bring them with you on the day of your procedure. Your physician has requested that you go to www.startemmi.com and enter the access code given to you at your visit today. This web site gives a general overview about your procedure. However, you should still follow specific instructions given to you by our office regarding your preparation for the procedure.  We have sent the following medications to your pharmacy for you to pick up at your convenience: levsin, you can purchase a probiotic over the counter ([facility], Align)                                                We are excited to introduce MyChart, a new best-in-class service that provides you online access to important information in your electronic medical record. We want to make it easier for you to view your health information - all in one secure location - when and where you need it. We expect MyChart will enhance the quality of care and service we provide.  When you register for MyChart, you can:    View your test results.    Request appointments and receive appointment reminders via email.    Request medication renewals.    View your medical history, allergies, medications and immunizations.    Communicate with your physician's office through a password-protected site.    Conveniently print information such as your medication lists.  To find out if MyChart is right for you, please talk to a member of our clinical staff today. We will gladly answer your questions about this free health and wellness tool.  If you are age 40 or older and want a member of your family to have access to your record, you must provide written consent by completing a proxy form available at our office. Please speak to our clinical staff about guidelines regarding  accounts for patients younger than age 40.  As you activate your MyChart account and need any technical assistance, please call the MyChart technical support line at [phone]) or email your question to [email or web address]. If you email your question(s), please include your name, a return phone number and the best time to reach you.  If you have non-urgent health-related questions, you can send a message to our office through MyChart at [email or web address]. If you have a medical emergency, call 911.  Thank you for using MyChart as your new health and wellness resource!   MyChart licensed from [employer],  [id number]. Patents Pending.

## 2013-08-18 ENCOUNTER — Encounter: Payer: 59 | Admitting: Internal Medicine

## 2013-08-19 ENCOUNTER — Ambulatory Visit: Payer: Self-pay | Admitting: Podiatrist

## 2013-08-30 ENCOUNTER — Encounter: Payer: Self-pay | Admitting: Internal Medicine

## 2013-08-30 MED ORDER — ESOMEPRAZOLE MAGNESIUM 40 MG PO CPDR: 40.0000 mg | DELAYED_RELEASE_CAPSULE | Freq: Every day | ORAL | Status: DC

## 2013-09-02 ENCOUNTER — Ambulatory Visit: Payer: Self-pay | Admitting: Podiatrist

## 2013-10-07 ENCOUNTER — Ambulatory Visit (INDEPENDENT_AMBULATORY_CARE_PROVIDER_SITE_OTHER): Payer: 59 | Admitting: Podiatrist

## 2013-10-07 ENCOUNTER — Ambulatory Visit (INDEPENDENT_AMBULATORY_CARE_PROVIDER_SITE_OTHER): Payer: 59

## 2013-10-07 ENCOUNTER — Telehealth: Payer: Self-pay | Admitting: Internal Medicine

## 2013-10-07 ENCOUNTER — Encounter: Payer: Self-pay | Admitting: Podiatrist

## 2013-10-07 VITALS — BP 128/93 | HR 71 | Resp 15 | Ht 76.0 in | Wt 232.0 lb

## 2013-10-07 DIAGNOSIS — M722 Plantar fascial fibromatosis: Secondary | ICD-10-CM

## 2013-10-07 DIAGNOSIS — M76829 Posterior tibial tendinitis, unspecified leg: Secondary | ICD-10-CM

## 2013-10-07 NOTE — Telephone Encounter (Signed)
Patient with epigastric pain.  At the last office visit Dr. Rhea BeltonPyrtle recommended an EGD.  The patient never scheduled.  He is having the same symptoms.  I have offered him an office visit for Tuesday of next week, he is not able to come due to work schedule.  I have offered to set up EGD he can't do that due to work schedule.  He says he will call back once he gets his work schedule to set up EGD.

## 2013-10-07 NOTE — Progress Notes (Signed)
   Subjective:    Patient ID: Chase Carey, male    DOB: [birth year], 38 y.o.   MRN: [id number]  HPI Comments: N arch pain L B/L arches and right medial forefoot D arches - 6 months     Right forefoot burning  - 1 month  O worsened after beginning exercise routine C arch - burning, stretching     Right forefoot - burning A walking T none attempted     Review of Systems  Gastrointestinal:       Gallstones  Genitourinary:       Gallstones       Objective:   Physical Exam  GENERAL APPEARANCE: Alert, conversant. Appropriately groomed. No acute distress.  VASCULAR: Pedal pulses palpable and strong bilateral.  Capillary refill time is immediate to all digits,  Proximal to distal cooling it warm to warm.  Digital hair growth is present bilateral  NEUROLOGIC: sensation is intact epicritically and protectively to 5.07 monofilament at 5/5 sites bilateral.  Light touch is intact bilateral, vibratory sensation intact bilateral, achilles tendon reflex is intact bilateral.  Negative Tinel sign is elicited MUSCULOSKELETAL: Pain on palpation  medial aspect of the right foot is noted consistent with a foot sprain.  No pain at the insertion on the heel is present.   Rectus foot type is seen. DERMATOLOGIC: skin color, texture, and turger are within normal limits.  No preulcerative lesions are seen, no interdigital maceration noted.  No open lesions present.  Digital nails are asymptomatic.      Assessment & Plan:  Plantar fasciitis  Plan:  Pain is mild and i recommended orthotics-- he was scanned for orthotics today.  Recommended stretches and decreased activity for now.  A plantar fascial strapping was applied.  He will be seen when inserts are ready for pickup

## 2013-11-04 ENCOUNTER — Other Ambulatory Visit: Payer: 59

## 2013-11-11 ENCOUNTER — Ambulatory Visit (INDEPENDENT_AMBULATORY_CARE_PROVIDER_SITE_OTHER): Payer: 59 | Admitting: *Deleted

## 2013-11-11 ENCOUNTER — Ambulatory Visit (AMBULATORY_SURGERY_CENTER): Payer: 59 | Admitting: *Deleted

## 2013-11-11 VITALS — Ht 76.0 in | Wt 243.0 lb

## 2013-11-11 DIAGNOSIS — R1013 Epigastric pain: Secondary | ICD-10-CM

## 2013-11-11 DIAGNOSIS — M722 Plantar fascial fibromatosis: Secondary | ICD-10-CM

## 2013-11-11 NOTE — Progress Notes (Signed)
No egg or soy allergy  No home oxygen use or problems with anesthesia  No medications for weight loss taken  emmi information given 

## 2013-11-11 NOTE — Patient Instructions (Signed)

## 2013-11-11 NOTE — Progress Notes (Signed)
Dispensed patient's orthotics with oral and written instructions for wearing. Patient will follow up with Dr. Egerton as needed. 

## 2013-11-23 ENCOUNTER — Encounter: Payer: Self-pay | Admitting: Internal Medicine

## 2013-11-23 ENCOUNTER — Ambulatory Visit (AMBULATORY_SURGERY_CENTER): Payer: 59 | Admitting: Internal Medicine

## 2013-11-23 VITALS — BP 120/73 | HR 62 | Temp 96.7°F | Resp 16 | Ht 76.0 in | Wt 243.0 lb

## 2013-11-23 DIAGNOSIS — R1013 Epigastric pain: Secondary | ICD-10-CM

## 2013-11-23 DIAGNOSIS — K2940 Chronic atrophic gastritis without bleeding: Secondary | ICD-10-CM

## 2013-11-23 MED ORDER — SODIUM CHLORIDE 0.9 % IV SOLN: 500.0000 mL | INTRAVENOUS | Status: DC

## 2013-11-23 NOTE — Op Note (Signed)
[facility] [address] [place], [address]   ENDOSCOPY PROCEDURE REPORT  PATIENT: Chase Carey, Chase Carey  MR#: [id number] BIRTHDATE: 06-14-1976 , 37  yrs. old GENDER: Male ENDOSCOPIST: Beverley Fiedler, MD REFERRED BY:  Duncan Dull, M.D. PROCEDURE DATE:  11/23/2013 PROCEDURE:  EGD w/ biopsy ASA CLASS:     Class III INDICATIONS:  Epigastric pain. MEDICATIONS: MAC sedation, administered by CRNA and Propofol (Diprivan) 240 mg IV TOPICAL ANESTHETIC: none  DESCRIPTION OF PROCEDURE: After the risks benefits and alternatives of the procedure were thoroughly explained, informed consent was obtained.  The LB [id number] endoscope was introduced through the mouth and advanced to the proximal jejunum. Without limitations.  The instrument was slowly withdrawn as the mucosa was fully examined.    ESOPHAGUS: An irregular Z-line was observed 40 cm from the incisors, 1 tongue of salmon-colored mucosa.  Multiple biopsies were performed using cold forceps.  Sample obtained to rule out Barrett's esophagus.   The esophagus was otherwise normal.  STOMACH: A Roux -en-Y anastomosis was found characterized as healthy in appearance.  Gastric pouch normal and 3-4 cm in length.  JEJUNUM: The exam showed no abnormalities in the efferent jejunal limb. Retroflexion was not performed.     The scope was then withdrawn from the patient and the procedure completed.  COMPLICATIONS: There were no complications.  ENDOSCOPIC IMPRESSION: 1.   Irregular Z-line was observed 40 cm from the incisors; multiple biopsies 2.   The esophagus was otherwise normal. 3.   Roux -en-Y anastomosis was found healthy appearing, 3-4 cm gastric pouch which is healthy appearing 4.   The exam showed no abnormalities in the examined efferent jejunal limb  RECOMMENDATIONS: 1.  Surgical referral for evaluation of cholecystectomy given episodic epigastric pain history of gallstones with unremarkable EGD 2.   Await biopsy results  eSigned:  Beverley Fiedler, MD 11/23/2013 10:18 AM CC:The Patient and Duncan Dull, MD

## 2013-11-23 NOTE — Progress Notes (Signed)
Called to room to assist during endoscopic procedure.  Patient ID and intended procedure confirmed with present staff. Received instructions for my participation in the procedure from the performing physician.  

## 2013-11-23 NOTE — Progress Notes (Signed)
A/ox3, pleased with MAC, report to RN 

## 2013-11-23 NOTE — Patient Instructions (Signed)

## 2013-11-24 ENCOUNTER — Telehealth: Payer: Self-pay

## 2013-11-24 NOTE — Telephone Encounter (Signed)
  Follow up Call-  Call back number 11/23/2013  Post procedure Call Back phone  # [phone]  Permission to leave phone message Yes     Patient questions:  Do you have a fever, pain , or abdominal swelling? no Pain Score  0 *  Have you tolerated food without any problems? yes  Have you been able to return to your normal activities? yes  Do you have any questions about your discharge instructions: Diet   no Medications  no Follow up visit  no  Do you have questions or concerns about your Care? no  Actions: * If pain score is 4 or above: No action needed, pain <4.  No problems per the pt. Maw

## 2013-11-25 ENCOUNTER — Other Ambulatory Visit: Payer: Self-pay | Admitting: *Deleted

## 2013-11-25 ENCOUNTER — Other Ambulatory Visit: Payer: Self-pay

## 2013-11-25 DIAGNOSIS — R1013 Epigastric pain: Secondary | ICD-10-CM

## 2013-11-25 DIAGNOSIS — K8020 Calculus of gallbladder without cholecystitis without obstruction: Secondary | ICD-10-CM

## 2013-11-25 MED ORDER — EPINEPHRINE 0.3 MG/0.3ML IJ SOAJ
0.3000 mg | Freq: Once | INTRAMUSCULAR | Status: DC
Start: 2013-11-25 — End: 2016-01-04

## 2013-12-01 ENCOUNTER — Encounter: Payer: Self-pay | Admitting: Internal Medicine

## 2013-12-09 ENCOUNTER — Ambulatory Visit (INDEPENDENT_AMBULATORY_CARE_PROVIDER_SITE_OTHER): Payer: 59 | Admitting: Podiatrist

## 2013-12-09 ENCOUNTER — Encounter: Payer: Self-pay | Admitting: Podiatrist

## 2013-12-09 VITALS — BP 116/76 | HR 85 | Resp 18

## 2013-12-09 DIAGNOSIS — M76829 Posterior tibial tendinitis, unspecified leg: Secondary | ICD-10-CM

## 2013-12-09 DIAGNOSIS — M76821 Posterior tibial tendinitis, right leg: Secondary | ICD-10-CM

## 2013-12-09 NOTE — Progress Notes (Signed)
THE ORTHOTICS ARE NOT DOING GOOD AT ALL AND THE PAIN IN MY RIGHT HEEL IS NO BETTER AND THE LEFT IS NOT THAT BAD AND THE FEET ARE COMFORTABLE BUT I STILL HAVE THE SHARP PAIN  Patient presents today for followup of right heel pain. He states he orthotics are not making the pain resolved.  Objective: Neurovascular status is unchanged. Pain along the posterior tibial tendon and at its insertion is noted today right. Assessment: Posterior tibial tendinitis  Plan: Injected on the medial aspect of the arch being careful to avoid the insertion point of the posterior tibial tendon. He will try this and if there is no improvement I will adjusted orthotics.

## 2013-12-20 ENCOUNTER — Encounter (INDEPENDENT_AMBULATORY_CARE_PROVIDER_SITE_OTHER): Payer: Self-pay | Admitting: General Surgery

## 2013-12-20 ENCOUNTER — Ambulatory Visit (INDEPENDENT_AMBULATORY_CARE_PROVIDER_SITE_OTHER): Payer: Commercial Managed Care - PPO | Admitting: General Surgery

## 2013-12-20 VITALS — BP 128/74 | HR 80 | Temp 98.3°F | Resp 16 | Ht 76.0 in | Wt 231.0 lb

## 2013-12-20 DIAGNOSIS — K8020 Calculus of gallbladder without cholecystitis without obstruction: Secondary | ICD-10-CM

## 2013-12-20 NOTE — Progress Notes (Signed)
Patient ID: Chase Carey, male   DOB: 1976/02/25, 38 y.o.   MRN: [id number]  Chief Complaint  Patient presents with  . Cholelithiasis    HPI Chase Carey is a 38 y.o. male.  Referred by Dr Erick BlinksJay Pyrtle HPI This is a 38 year old male who underwent a laparoscopic gastric bypass for which [facility] has [facility]. He over the last 3 years has begun to have some what he describes more as chest pain that begins and then radiates into his upper abdomen and a band around to his back. It helps when he lies down when he usually goes away. This happens a couple times a week. This is gone worse than last year with more severe pain and more frequency. At the beginning he did have some nausea and some dry heaves.Its not related to any food at all. He is an EGD recently that is negative. He underwent evaluation for this pain at the end of last year that showed a normal liver, cholelithiasis with a negative sonographic Murphy sign a normal gallbladder wall as well as a normal common bile duct. He also had a normal HIDA scan. He comes in today for evaluation for possible cholecystectomy.  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 Past Medical History  Diagnosis Date  . Obesity, unspecified   . Anal fissure   . Asymptomatic varicose veins   . Psychogenic pain, site unspecified   . Elevated blood pressure reading without diagnosis of hypertension   . Plantar fascial fibromatosis   . Seborrhea capitis   . Other dyspnea and respiratory abnormality   . Sprain of neck   . Cervicalgia   . Balanoposthitis   . Anemia, unspecified   . Other abnormal blood chemistry   . Lumbago   . Localized superficial swelling, mass, or lump   . Sleep apnea     mild  . Gallstones   . Chronic headaches   . Arthritis     ankle  . GERD (gastroesophageal reflux disease)   . Tuberculosis     positive PPD; negative chest x ray- 2000, took medication for 6 months    Past Surgical History  Procedure  Laterality Date  . Gastric bypass  10/17/2009  . Rle varicose vein laser procedure  2011    [facility]  . Hemorrhoidectomy with hemorrhoid banding  2011  . Vasectomy  02/25/2013    no report of complications  . Vein ligation and stripping Right 04/07/13    leg    Family History  Problem Relation Age of Onset  . Adopted: Yes  . Other      adopted    Social History History  Substance Use Topics  . Smoking status: Never Smoker   . Smokeless tobacco: Never Used     Comment: smokes one cigar per year  . Alcohol Use: No     Comment: occasional  1 - 2 times per year wine    Allergies  Allergen Reactions  . Shrimp [Shellfish Allergy] Anaphylaxis    Shortness of breath.    Current Outpatient Prescriptions  Medication Sig Dispense Refill  . calcium carbonate (OS-CAL) 600 MG TABS Take 600 mg by mouth 2 (two) times daily with a meal.      . cholecalciferol (VITAMIN D) 1000 UNITS tablet Take 3,000 Units by mouth daily.       . clobetasol (OLUX) 0.05 % topical foam Apply topically 2 (two) times daily.      .Marland Kitchen  EPINEPHrine (EPIPEN 2-PAK) 0.3 mg/0.3 mL IJ SOAJ injection Inject 0.3 mLs (0.3 mg total) into the muscle once.  1 Device  1  . esomeprazole (NEXIUM) 40 MG capsule Take 1 capsule (40 mg total) by mouth daily.  30 capsule  3  . hyoscyamine (LEVSIN SL) 0.125 MG SL tablet Place 1 tablet (0.125 mg total) under the tongue every 4 (four) hours as needed.  30 tablet  0  . Multiple Vitamin (MULTIVITAMINS PO) Take by mouth daily.      . Multiple Vitamins-Minerals (ZINC PO) Take 1 tablet by mouth daily.      . Probiotic Product (PROBIOTIC DAILY PO) Take by mouth daily.      . vitamin B-12 (CYANOCOBALAMIN) 100 MCG tablet Take 100 mcg by mouth daily.       No current facility-administered medications for this visit.    Review of Systems Review of Systems  Constitutional: Negative for fever, chills and unexpected weight change.  HENT: Negative for congestion, hearing loss, sore throat, trouble  swallowing and voice change.   Eyes: Negative for visual disturbance.  Respiratory: Negative for cough and wheezing.   Cardiovascular: Negative for chest pain, palpitations and leg swelling.  Gastrointestinal: Positive for blood in stool (states internal hemorrhoids). Negative for nausea, vomiting, abdominal pain, diarrhea, constipation, abdominal distention, anal bleeding and rectal pain.  Genitourinary: Negative for hematuria and difficulty urinating.  Musculoskeletal: Negative for arthralgias.  Skin: Negative for rash and wound.  Neurological: Negative for seizures, syncope, weakness and headaches.  Hematological: Negative for adenopathy. Does not bruise/bleed easily.  Psychiatric/Behavioral: Negative for confusion.    Blood pressure 128/74, pulse 80, temperature 98.3 F (36.8 C), resp. rate 16, height 6\' 4"  (1.93 m), weight 231 lb (104.781 kg).  Physical Exam Physical Exam  Vitals reviewed. Constitutional: He appears well-developed and well-nourished.  Eyes: No scleral icterus.  Neck: Neck supple.  Cardiovascular: Normal rate, regular rhythm and normal heart sounds.   Pulmonary/Chest: Effort normal and breath sounds normal. He has no wheezes. He has no rales.  Abdominal: Soft. Bowel sounds are normal. He exhibits no distension. There is no tenderness. There is negative Murphy's sign. No hernia.  Lymphadenopathy:    He has no cervical adenopathy.    Data Reviewed Dr Sherald BargePyrtles notes, hida and us  Assessment    Abdominal pain cholelithiasis     Plan    He does have gallstones but I'm notentirely sure that his symptoms are related to his gallbladder. I think I'm going to send him for a CT scan given his findings and his lower abdomen. I will plan on seeing him back after that I did discuss a cholecystectomy at some point but we discussed that it may be non-therapeutic.        WAKEFIELD,MATTHEW 12/20/2013, 2:11 PM

## 2013-12-28 ENCOUNTER — Ambulatory Visit (HOSPITAL_COMMUNITY): Payer: 59

## 2013-12-30 ENCOUNTER — Telehealth (INDEPENDENT_AMBULATORY_CARE_PROVIDER_SITE_OTHER): Payer: Self-pay

## 2013-12-30 NOTE — Telephone Encounter (Signed)
Pt canceled CT A/P and said he would r/s the test when he was ready. I notified Dr Dwain SarnaWakefield.

## 2014-01-17 ENCOUNTER — Ambulatory Visit (HOSPITAL_COMMUNITY)
Admission: RE | Admit: 2014-01-17 | Discharge: 2014-01-17 | Disposition: A | Discharge status: Home / Self Care | Payer: 59 | Source: Ambulatory Visit | Attending: General Surgery | Admitting: General Surgery

## 2014-01-17 DIAGNOSIS — K8020 Calculus of gallbladder without cholecystitis without obstruction: Secondary | ICD-10-CM

## 2014-01-17 DIAGNOSIS — Z9884 Bariatric surgery status: Secondary | ICD-10-CM | POA: Insufficient documentation

## 2014-01-17 DIAGNOSIS — R1011 Right upper quadrant pain: Secondary | ICD-10-CM | POA: Insufficient documentation

## 2014-01-17 DIAGNOSIS — R079 Chest pain, unspecified: Secondary | ICD-10-CM | POA: Insufficient documentation

## 2014-01-17 MED ORDER — IOHEXOL 300 MG/ML  SOLN
100.0000 mL | Freq: Once | INTRAMUSCULAR | Status: AC | PRN
  Administered 2014-01-17: 90 mL via INTRAVENOUS

## 2014-02-01 ENCOUNTER — Telehealth (INDEPENDENT_AMBULATORY_CARE_PROVIDER_SITE_OTHER): Payer: Self-pay

## 2014-02-01 NOTE — Telephone Encounter (Signed)
Message copied by Ethlyn GallerySPILLERS, Antoneo Ghrist on Wed Feb 01, 2014  5:10 PM ------      Message from: [facility], [place]      Created: Tue Jan 31, 2014  4:24 PM       Im not sure this is gb.  He has some swirling that may or may not be related. Ideally he would see his bypass surgeon at [facility] before I would do anything or just get this done there      ----- Message -----         From: Ethlyn GalleryAlisha Jhoan Schmieder, MA         Sent: 01/31/2014   3:45 PM           To: Emelia LoronMatthew Wakefield, MD            Pt saw us 6/30 for gallbladder and we had set up CT for 7/10 b/c u were not sure all of his symptoms were gallbladder related. Pt cx'd CT in July and he r/s the CT himself. You said in your note you would see him back if gallbladder related to discuss sx. Please advise.            ----- Message -----         From: Emelia LoronMatthew Wakefield, MD         Sent: 01/31/2014   3:20 PM           To: Ethlyn GalleryAlisha Ryen Heitmeyer, MA            What was I supposed to do with him? Was he due to come see me       ----- Message -----         From: Rad Results In Interface         Sent: 01/17/2014   2:30 PM           To: Emelia LoronMatthew Wakefield, MD                               ------

## 2014-02-01 NOTE — Telephone Encounter (Signed)
Called pt to notify him of the CT scan results. Per Dr Dwain SarnaWakefield he is still not sure that the abdominal pain is coming from the gallbladder. The CT scan mentions some swirling that may or may not be related to the bypass sx. Dr Dwain SarnaWakefield would advise pt to see his gastric bypass surgeon at [facility] first before Dr Dwain SarnaWakefield would consider doing anything or pt can just talk to the gastric bypass doctor about the issues to see is someone at [facility] could see him. I advised pt that after he see's his doctor if he still wants to speak to Dr Dwain SarnaWakefield to call us back and we will make him an appt. Pt understands and agrees with this plan.

## 2014-02-22 ENCOUNTER — Encounter: Payer: Self-pay | Admitting: Internal Medicine

## 2014-02-22 ENCOUNTER — Ambulatory Visit (INDEPENDENT_AMBULATORY_CARE_PROVIDER_SITE_OTHER): Payer: 59 | Admitting: Internal Medicine

## 2014-02-22 VITALS — BP 118/70 | HR 75 | Temp 98.7°F | Resp 16 | Ht 76.0 in | Wt 239.2 lb

## 2014-02-22 DIAGNOSIS — E559 Vitamin D deficiency, unspecified: Secondary | ICD-10-CM

## 2014-02-22 DIAGNOSIS — R5381 Other malaise: Secondary | ICD-10-CM

## 2014-02-22 DIAGNOSIS — K2100 Gastro-esophageal reflux disease with esophagitis, without bleeding: Secondary | ICD-10-CM

## 2014-02-22 DIAGNOSIS — E786 Lipoprotein deficiency: Secondary | ICD-10-CM

## 2014-02-22 DIAGNOSIS — Z0000 Encounter for general adult medical examination without abnormal findings: Secondary | ICD-10-CM

## 2014-02-22 DIAGNOSIS — Z9852 Vasectomy status: Secondary | ICD-10-CM

## 2014-02-22 DIAGNOSIS — R5383 Other fatigue: Secondary | ICD-10-CM

## 2014-02-22 DIAGNOSIS — E669 Obesity, unspecified: Secondary | ICD-10-CM

## 2014-02-22 DIAGNOSIS — Z125 Encounter for screening for malignant neoplasm of prostate: Secondary | ICD-10-CM

## 2014-02-22 DIAGNOSIS — E785 Hyperlipidemia, unspecified: Secondary | ICD-10-CM

## 2014-02-22 LAB — CBC WITH DIFFERENTIAL/PLATELET
BASOS ABS: 0 10*3/uL (ref 0.0–0.1)
Basophils Relative: 0.5 % (ref 0.0–3.0)
EOS ABS: 0.1 10*3/uL (ref 0.0–0.7)
Eosinophils Relative: 1.4 % (ref 0.0–5.0)
HEMATOCRIT: 36.9 % — AB (ref 39.0–52.0)
Hemoglobin: 12.1 g/dL — ABNORMAL LOW (ref 13.0–17.0)
LYMPHS ABS: 2.1 10*3/uL (ref 0.7–4.0)
Lymphocytes Relative: 38.4 % (ref 12.0–46.0)
MCHC: 32.7 g/dL (ref 30.0–36.0)
MCV: 99.8 fl (ref 78.0–100.0)
MONOS PCT: 7.8 % (ref 3.0–12.0)
Monocytes Absolute: 0.4 10*3/uL (ref 0.1–1.0)
Neutro Abs: 2.9 10*3/uL (ref 1.4–7.7)
Neutrophils Relative %: 51.9 % (ref 43.0–77.0)
PLATELETS: 186 10*3/uL (ref 150.0–400.0)
RBC: 3.7 Mil/uL — ABNORMAL LOW (ref 4.22–5.81)
RDW: 13.2 % (ref 11.5–15.5)
WBC: 5.6 10*3/uL (ref 4.0–10.5)

## 2014-02-22 LAB — COMPREHENSIVE METABOLIC PANEL
ALBUMIN: 3.9 g/dL (ref 3.5–5.2)
ALK PHOS: 48 U/L (ref 39–117)
ALT: 15 U/L (ref 0–53)
AST: 32 U/L (ref 0–37)
BUN: 12 mg/dL (ref 6–23)
CO2: 29 mEq/L (ref 19–32)
Calcium: 9.2 mg/dL (ref 8.4–10.5)
Chloride: 102 mEq/L (ref 96–112)
Creatinine, Ser: 0.9 mg/dL (ref 0.4–1.5)
GFR: 121.72 mL/min (ref >60.00)
Glucose, Bld: 82 mg/dL (ref 70–99)
POTASSIUM: 3.9 meq/L (ref 3.5–5.1)
SODIUM: 137 meq/L (ref 135–145)
TOTAL PROTEIN: 6.9 g/dL (ref 6.0–8.3)
Total Bilirubin: 1 mg/dL (ref 0.2–1.2)

## 2014-02-22 LAB — LIPID PANEL
Cholesterol: 188 mg/dL (ref 0–200)
HDL: 70.2 mg/dL (ref >39.00)
LDL Cholesterol: 107 mg/dL — ABNORMAL HIGH (ref 0–99)
NONHDL: 117.8
Total CHOL/HDL Ratio: 3
Triglycerides: 54 mg/dL (ref 0.0–149.0)
VLDL: 10.8 mg/dL (ref 0.0–40.0)

## 2014-02-22 LAB — PSA: PSA: 0.51 ng/mL (ref 0.10–4.00)

## 2014-02-22 LAB — VITAMIN D 25 HYDROXY (VIT D DEFICIENCY, FRACTURES): VITD: 37.36 ng/mL (ref 30.00–100.00)

## 2014-02-22 NOTE — Progress Notes (Signed)
Patient ID: Chase Carey, male   DOB: 02/01/1976, 38 y.o.   MRN: [id number]   Patient Active Problem List   Diagnosis Date Noted  . Routine general medical examination at a health care facility 04/12/2013  . Varicose veins of lower extremities with other complications 04/11/2013  . Esophageal reflux 10/29/2012  . S/P vasectomy 10/29/2012  . Obesity, unspecified 09/24/2012    Subjective:  CC:   Chief Complaint  Patient presents with  . Annual Exam    HPI:   Chase Carey is a 38 y.o. male who presents for follow up on overweight,  Last seen oct 21  For his annual exam.  Has been exercising regularly and follow  Post gastric bypass low glycemic index diet.  NO new complaints   Past Medical History  Diagnosis Date  . Obesity, unspecified   . Anal fissure   . Asymptomatic varicose veins   . Psychogenic pain, site unspecified   . Elevated blood pressure reading without diagnosis of hypertension   . Plantar fascial fibromatosis   . Seborrhea capitis   . Other dyspnea and respiratory abnormality   . Sprain of neck   . Cervicalgia   . Balanoposthitis   . Anemia, unspecified   . Other abnormal blood chemistry   . Lumbago   . Localized superficial swelling, mass, or lump   . Sleep apnea     mild  . Gallstones   . Chronic headaches   . Arthritis     ankle  . GERD (gastroesophageal reflux disease)   . Tuberculosis     positive PPD; negative chest x ray- 2000, took medication for 6 months    Past Surgical History  Procedure Laterality Date  . Gastric bypass  10/17/2009  . Rle varicose vein laser procedure  2011    [facility]  . Hemorrhoidectomy with hemorrhoid banding  2011  . Vasectomy  02/25/2013    no report of complications  . Vein ligation and stripping Right 04/07/13    leg       The following portions of the patient's history were reviewed and updated as appropriate: Allergies, current medications, and problem list.    Review of Systems:   Patient denies  headache, fevers, malaise, unintentional weight loss, skin rash, eye pain, sinus congestion and sinus pain, sore throat, dysphagia,  hemoptysis , cough, dyspnea, wheezing, chest pain, palpitations, orthopnea, edema, abdominal pain, nausea, melena, diarrhea, constipation, flank pain, dysuria, hematuria, urinary  Frequency, nocturia, numbness, tingling, seizures,  Focal weakness, Loss of consciousness,  Tremor, insomnia, depression, anxiety, and suicidal ideation.     History   Social History  . Marital Status: Married    Spouse Name: N/A    Number of Children: 2  . Years of Education: college   Occupational History  . [employer]    Social History Main Topics  . Smoking status: Never Smoker   . Smokeless tobacco: Never Used     Comment: smokes one cigar per year  . Alcohol Use: No     Comment: occasional  1 - 2 times per year wine  . Drug Use: No  . Sexual Activity: Not on file   Other Topics Concern  . Not on file   Social History Narrative   Unknown family history. Adopted. Married x 10.5 years, happily married. Caffeine use: Coffee, 2 servings per day. Guns in the home stored in locked cabinet, loaded. No formal exercise program.    Objective:  Filed Vitals:   02/22/14   0909  BP: 118/70  Pulse: 75  Temp: 98.7 F (37.1 C)  Resp: 16     General appearance: alert, cooperative and appears stated age Ears: normal TM's and external ear canals both ears Throat: lips, mucosa, and tongue normal; teeth and gums normal Neck: no adenopathy, no carotid bruit, supple, symmetrical, trachea midline and thyroid not enlarged, symmetric, no tenderness/mass/nodules Back: symmetric, no curvature. ROM normal. No CVA tenderness. Lungs: clear to auscultation bilaterally Heart: regular rate and rhythm, S1, S2 normal, no murmur, click, rub or gallop Abdomen: soft, non-tender; bowel sounds normal; no masses,  no organomegaly Pulses: 2+ and symmetric Skin: Skin color, texture, turgor  normal. No rashes or lesions Lymph nodes: Cervical, supraclavicular, and axillary nodes normal.  Assessment and Plan:  Esophageal reflux  Improved with use of nexium    Obesity, unspecified He has gained 8 lbs since last year.  I have addressed  BMI and recommended wt loss of 10% of body weigh over the next 6 months using a low glycemic index diet and regular exercise a minimum of 5 days per week.    Routine general medical examination at a health care facility Annual male exam was done including testicular exam and check for hernias.  Screening for STDS and depressive symptoms was negative. Vaccines were brought up to date.  Healthy habits were reviewed including use of seatbelts 100% of the time , moderate alcohol consumption, regular exercise, and the  mediterranean diet.    Updated Medication List Outpatient Encounter Prescriptions as of 02/22/2014  Medication Sig  . calcium carbonate (OS-CAL) 600 MG TABS Take 600 mg by mouth 2 (two) times daily with a meal.  . cholecalciferol (VITAMIN D) 1000 UNITS tablet Take 3,000 Units by mouth daily.   . clobetasol (OLUX) 0.05 % topical foam Apply topically 2 (two) times daily.  . EPINEPHrine (EPIPEN 2-PAK) 0.3 mg/0.3 mL IJ SOAJ injection Inject 0.3 mLs (0.3 mg total) into the muscle once.  . esomeprazole (NEXIUM) 40 MG capsule Take 1 capsule (40 mg total) by mouth daily.  . hyoscyamine (LEVSIN SL) 0.125 MG SL tablet Place 1 tablet (0.125 mg total) under the tongue every 4 (four) hours as needed.  . Multiple Vitamin (MULTIVITAMINS PO) Take by mouth daily.  . Multiple Vitamins-Minerals (ZINC PO) Take 1 tablet by mouth daily.  . Probiotic Product (PROBIOTIC DAILY PO) Take by mouth daily.  . vitamin B-12 (CYANOCOBALAMIN) 100 MCG tablet Take 100 mcg by mouth daily.     Orders Placed This Encounter  Procedures  . Vit D  25 hydroxy (rtn osteoporosis monitoring)  . Comp Met (CMET)  . PSA  . CBC with Differential  . Lipid panel    Return in  about 1 year (around 02/23/2015).       .   

## 2014-02-22 NOTE — Progress Notes (Signed)
Pre-visit discussion using our clinic review tool. No additional management support is needed unless otherwise documented below in the visit note.  

## 2014-02-22 NOTE — Patient Instructions (Addendum)
Try adding famotidine 20 mg in the late evening to prevent early morning reflux  You can try replacing the morning dose of nexium with a second dose of famotidine if it works as well. (it will save you $$$)  Avoid eating within 2 hours of lying down  Avoid eating mint and chocolate after dinner bc they relax the esophageal sphincter.    We will call you with the results of your labs today

## 2014-02-24 ENCOUNTER — Encounter: Payer: Self-pay | Admitting: Internal Medicine

## 2014-02-24 NOTE — Telephone Encounter (Signed)
Is the powder Nystatin?

## 2014-02-25 ENCOUNTER — Encounter: Payer: Self-pay | Admitting: Internal Medicine

## 2014-02-25 NOTE — Assessment & Plan Note (Signed)
He has gained 8 lbs since last year.  I have addressed  BMI and recommended wt loss of 10% of body weigh over the next 6 months using a low glycemic index diet and regular exercise a minimum of 5 days per week.

## 2014-02-25 NOTE — Assessment & Plan Note (Signed)
Improved with use of nexium

## 2014-02-25 NOTE — Assessment & Plan Note (Signed)
  Annual male exam was done including testicular exam and check for hernias.  Screening for STDS and depressive symptoms was negative. Vaccines were brought up to date.  Healthy habits were reviewed including use of seatbelts 100% of the time , moderate alcohol consumption, regular exercise, and the  mediterranean diet.  

## 2014-02-27 MED ORDER — NYSTATIN 100000 UNIT/GM EX POWD: Freq: Two times a day (BID) | CUTANEOUS | Status: DC

## 2014-06-19 ENCOUNTER — Telehealth: Payer: Self-pay | Admitting: *Deleted

## 2014-06-19 ENCOUNTER — Telehealth: Payer: Self-pay | Admitting: Internal Medicine

## 2014-06-19 DIAGNOSIS — G8929 Other chronic pain: Secondary | ICD-10-CM

## 2014-06-19 DIAGNOSIS — R1011 Right upper quadrant pain: Principal | ICD-10-CM

## 2014-06-19 NOTE — Telephone Encounter (Signed)
Pt requesting referral to [facility] [place]. He has seen Dr. Rhea BeltonPyrtle and Dr. Dwain SarnaWakefield, but Dr. Dwain SarnaWakefield suggested a referral to [facility] for evaluation. Advised [facility] would call once appt has been scheduled.

## 2014-06-19 NOTE — Telephone Encounter (Signed)
Referral is in process as requested  To [facility]

## 2014-06-19 NOTE — Telephone Encounter (Signed)
Dr. Dwain SarnaWakefield said surgery would not work for him and Dr. Renaldo FiddlerWakefiled recommended he be seen at [facility] for further evaluation for continued abdominal pain, but referral has to come from PCP

## 2014-06-19 NOTE — Telephone Encounter (Signed)
A user error has taken place.

## 2014-06-19 NOTE — Telephone Encounter (Signed)
I have reviewed his chart  And find no reference to [facility].  Patient needs to tell me the reason he needs a referral

## 2014-11-30 ENCOUNTER — Ambulatory Visit: Payer: Self-pay | Admitting: Internal Medicine

## 2014-12-12 ENCOUNTER — Encounter: Payer: Self-pay | Admitting: Internal Medicine

## 2014-12-12 ENCOUNTER — Ambulatory Visit (INDEPENDENT_AMBULATORY_CARE_PROVIDER_SITE_OTHER): Payer: 59 | Admitting: Internal Medicine

## 2014-12-12 VITALS — BP 116/60 | HR 99 | Temp 98.2°F | Resp 12 | Ht 76.0 in | Wt 249.0 lb

## 2014-12-12 DIAGNOSIS — E559 Vitamin D deficiency, unspecified: Secondary | ICD-10-CM | POA: Diagnosis not present

## 2014-12-12 DIAGNOSIS — R413 Other amnesia: Secondary | ICD-10-CM | POA: Diagnosis not present

## 2014-12-12 DIAGNOSIS — Z9884 Bariatric surgery status: Secondary | ICD-10-CM | POA: Diagnosis not present

## 2014-12-12 DIAGNOSIS — R5383 Other fatigue: Secondary | ICD-10-CM | POA: Diagnosis not present

## 2014-12-12 DIAGNOSIS — E669 Obesity, unspecified: Secondary | ICD-10-CM

## 2014-12-12 DIAGNOSIS — F988 Other specified behavioral and emotional disorders with onset usually occurring in childhood and adolescence: Secondary | ICD-10-CM | POA: Insufficient documentation

## 2014-12-12 DIAGNOSIS — Z1159 Encounter for screening for other viral diseases: Secondary | ICD-10-CM | POA: Diagnosis not present

## 2014-12-12 DIAGNOSIS — R41840 Attention and concentration deficit: Secondary | ICD-10-CM

## 2014-12-12 LAB — COMPREHENSIVE METABOLIC PANEL
ALT: 14 U/L (ref 0–53)
AST: 27 U/L (ref 0–37)
Albumin: 4 g/dL (ref 3.5–5.2)
Alkaline Phosphatase: 54 U/L (ref 39–117)
BILIRUBIN TOTAL: 0.6 mg/dL (ref 0.2–1.2)
BUN: 16 mg/dL (ref 6–23)
CO2: 26 mEq/L (ref 19–32)
CREATININE: 0.74 mg/dL (ref 0.40–1.50)
Calcium: 9 mg/dL (ref 8.4–10.5)
Chloride: 104 mEq/L (ref 96–112)
GFR: 150.01 mL/min (ref >60.00)
Glucose, Bld: 58 mg/dL — ABNORMAL LOW (ref 70–99)
Potassium: 3.9 mEq/L (ref 3.5–5.1)
Sodium: 137 mEq/L (ref 135–145)
Total Protein: 6.9 g/dL (ref 6.0–8.3)

## 2014-12-12 LAB — VITAMIN B12: VITAMIN B 12: 302 pg/mL (ref 211–911)

## 2014-12-12 LAB — MAGNESIUM: Magnesium: 1.8 mg/dL (ref 1.5–2.5)

## 2014-12-12 LAB — VITAMIN D 25 HYDROXY (VIT D DEFICIENCY, FRACTURES): VITD: 31.66 ng/mL (ref 30.00–100.00)

## 2014-12-12 MED ORDER — BUPROPION HCL 75 MG PO TABS
75.0000 mg | ORAL_TABLET | Freq: Two times a day (BID) | ORAL | Status: DC
Start: 2014-12-12 — End: 2015-04-09

## 2014-12-12 NOTE — Progress Notes (Signed)
Pre-visit discussion using our clinic review tool. No additional management support is needed unless otherwise documented below in the visit note.  

## 2014-12-12 NOTE — Progress Notes (Signed)
Subjective:  Patient ID: Chase Carey, male    DOB: 04-19-1976  Age: 39 y.o. MRN: [id number]  CC: The primary encounter diagnosis was Memory difficulties. Diagnoses of Need for hepatitis C screening test, Vitamin D deficiency, S/P gastric bypass, Other fatigue, Concentration deficit, and Obesity were also pertinent to this visit.  HPI Chase Carey presents for recent onset of mood changes. Patient states that when the weather turned arm, he developed anhedonia ,  Short temper,  Irritability.  He denies feelings of hopeless, suicidality and  insomnia.  Works [employer],  Has been irritable at work to [employer]. He remains fulfilled in his job. No regrets or resentments. Doesn't enjoy cooking any more.  Frustrated by having a "bad memory" , which he clarifies by stating that he has difficulty focusing and remembering details. His difficulty concentrating Has been an issue since he was an adolescent .  He recalls having difficulty  finishing assignments on  Time and did not get good grades in middle or high school.     Outpatient Prescriptions Prior to Visit  Medication Sig Dispense Refill  . calcium carbonate (OS-CAL) 600 MG TABS Take 600 mg by mouth 2 (two) times daily with a meal.    . cholecalciferol (VITAMIN D) 1000 UNITS tablet Take 3,000 Units by mouth daily.     . clobetasol (OLUX) 0.05 % topical foam Apply topically 2 (two) times daily.    Marland Kitchen EPINEPHrine (EPIPEN 2-PAK) 0.3 mg/0.3 mL IJ SOAJ injection Inject 0.3 mLs (0.3 mg total) into the muscle once. 1 Device 1  . esomeprazole (NEXIUM) 40 MG capsule Take 1 capsule (40 mg total) by mouth daily. 30 capsule 3  . hyoscyamine (LEVSIN SL) 0.125 MG SL tablet Place 1 tablet (0.125 mg total) under the tongue every 4 (four) hours as needed. 30 tablet 0  . Multiple Vitamin (MULTIVITAMINS PO) Take by mouth daily.    . Multiple Vitamins-Minerals (ZINC PO) Take 1 tablet by mouth daily.    Marland Kitchen nystatin (MYCOSTATIN) powder Apply topically 2  (two) times daily. 15 g 3  . Probiotic Product (PROBIOTIC DAILY PO) Take by mouth daily.    . vitamin B-12 (CYANOCOBALAMIN) 100 MCG tablet Take 100 mcg by mouth daily.     No facility-administered medications prior to visit.    Review of Systems;  Patient denies headache, fevers, malaise, unintentional weight loss, skin rash, eye pain, sinus congestion and sinus pain, sore throat, dysphagia,  hemoptysis , cough, dyspnea, wheezing, chest pain, palpitations, orthopnea, edema, abdominal pain, nausea, melena, diarrhea, constipation, flank pain, dysuria, hematuria, urinary  Frequency, nocturia, numbness, tingling, seizures,  Focal weakness, Loss of consciousness,  Tremor, insomnia, depression, anxiety, and suicidal ideation.      Objective:  BP 116/60 mmHg  Pulse 99  Temp(Src) 98.2 F (36.8 C) (Oral)  Resp 12  Ht '6\' 4"'  (1.93 m)  Wt 249 lb (112.946 kg)  BMI 30.32 kg/m2  SpO2 98%  BP Readings from Last 3 Encounters:  12/12/14 116/60  02/22/14 118/70  12/20/13 128/74    Wt Readings from Last 3 Encounters:  12/12/14 249 lb (112.946 kg)  02/22/14 239 lb 4 oz (108.523 kg)  12/20/13 231 lb (104.781 kg)    General appearance: alert, cooperative and appears stated age Ears: normal TM's and external ear canals both ears Throat: lips, mucosa, and tongue normal; teeth and gums normal Neck: no adenopathy, no carotid bruit, supple, symmetrical, trachea midline and thyroid not enlarged, symmetric, no tenderness/mass/nodules Back: symmetric, no  curvature. ROM normal. No CVA tenderness. Lungs: clear to auscultation bilaterally Heart: regular rate and rhythm, S1, S2 normal, no murmur, click, rub or gallop Abdomen: soft, non-tender; bowel sounds normal; no masses,  no organomegaly Pulses: 2+ and symmetric Skin: Skin color, texture, turgor normal. No rashes or lesions Psych: affect normal, makes good eye contact. No fidgeting,  Seems frustrated.  Denies suicidal thoughts    Lab Results    Component Value Date   HGBA1C 4.9 09/24/2012    Lab Results  Component Value Date   CREATININE 0.74 12/12/2014   CREATININE 0.9 02/22/2014   CREATININE 0.7 04/11/2013    Lab Results  Component Value Date   WBC 5.6 02/22/2014   HGB 12.1* 02/22/2014   HCT 36.9* 02/22/2014   PLT 186.0 02/22/2014   GLUCOSE 58* 12/12/2014   CHOL 188 02/22/2014   TRIG 54.0 02/22/2014   HDL 70.20 02/22/2014   LDLCALC 107* 02/22/2014   ALT 14 12/12/2014   AST 27 12/12/2014   NA 137 12/12/2014   K 3.9 12/12/2014   CL 104 12/12/2014   CREATININE 0.74 12/12/2014   BUN 16 12/12/2014   CO2 26 12/12/2014   TSH 1.510 12/12/2014   PSA 0.51 02/22/2014   HGBA1C 4.9 09/24/2012       Assessment & Plan:   Problem List Items Addressed This Visit    Obesity     I have reviewed   BMI and diet,  recommended continuinig a low glycemic index diet utilizing 6 small meals daily and continued participation in regular exercise  With 30 minutes of aerobic exercise a minimum of 5 days per week.        Concentration deficit - Primary    Screening for thyroid  and B12 deficiencies.   Trial of wellbutrin      S/P gastric bypass    Surgery was 2011.  Mg , b12 , Vit D are al normal today.       Relevant Orders   Magnesium (Completed)    Other Visit Diagnoses    Need for hepatitis C screening test        Relevant Orders    Hepatitis C antibody (Completed)    Vitamin D deficiency        Relevant Orders    Vit D  25 hydroxy (rtn osteoporosis monitoring) (Completed)    Other fatigue        Relevant Orders    Comp Met (CMET) (Completed)     A total of 25 minutes of face to face time was spent with patient more than half of which was spent in counselling and coordination of care    I am having Mr. Williard start on buPROPion. I am also having him maintain his vitamin B-12, calcium carbonate, Multiple Vitamin (MULTIVITAMINS PO), clobetasol, cholecalciferol, Multiple Vitamins-Minerals (ZINC PO), hyoscyamine,  esomeprazole, Probiotic Product (PROBIOTIC DAILY PO), EPINEPHrine, and nystatin.  Meds ordered this encounter  Medications  . buPROPion (WELLBUTRIN) 75 MG tablet    Sig: Take 1 tablet (75 mg total) by mouth 2 (two) times daily.    Dispense:  60 tablet    Refill:  2    There are no discontinued medications.  Follow-up: Return in about 3 months (around 03/14/2015).   Crecencio Mc, MD

## 2014-12-12 NOTE — Patient Instructions (Signed)
We are going to try wellbutrin IF your labs are all normal  If you start the medication  take it at  5 am and 2 pm    If it makes you more irritable,  Stop it and let me know

## 2014-12-13 LAB — T4 AND TSH
T4, Total: 6.3 ug/dL (ref 4.5–12.0)
TSH: 1.51 u[IU]/mL (ref 0.450–4.500)

## 2014-12-13 LAB — HEPATITIS C ANTIBODY: HCV Ab: NEGATIVE

## 2014-12-14 ENCOUNTER — Encounter: Payer: Self-pay | Admitting: Internal Medicine

## 2014-12-14 DIAGNOSIS — Z9889 Other specified postprocedural states: Secondary | ICD-10-CM | POA: Insufficient documentation

## 2014-12-14 DIAGNOSIS — Z9884 Bariatric surgery status: Secondary | ICD-10-CM | POA: Insufficient documentation

## 2014-12-14 NOTE — Assessment & Plan Note (Signed)
Screening for thyroid  and B12 deficiencies.   Trial of wellbutrin

## 2014-12-14 NOTE — Assessment & Plan Note (Addendum)
Surgery was 2011.  Mg , b12 , Vit D are al normal today.

## 2014-12-14 NOTE — Assessment & Plan Note (Signed)
I have reviewed   BMI and diet,  recommended continuinig a low glycemic index diet utilizing 6 small meals daily and continued participation in regular exercise  With 30 minutes of aerobic exercise a minimum of 5 days per week.

## 2015-01-25 ENCOUNTER — Encounter: Payer: Self-pay | Admitting: Podiatry

## 2015-01-25 ENCOUNTER — Ambulatory Visit (INDEPENDENT_AMBULATORY_CARE_PROVIDER_SITE_OTHER): Payer: 59 | Admitting: Podiatry

## 2015-01-25 VITALS — BP 119/82 | HR 73 | Resp 16 | Ht 76.0 in | Wt 240.0 lb

## 2015-01-25 DIAGNOSIS — M76829 Posterior tibial tendinitis, unspecified leg: Secondary | ICD-10-CM | POA: Diagnosis not present

## 2015-01-25 DIAGNOSIS — M2142 Flat foot [pes planus] (acquired), left foot: Secondary | ICD-10-CM

## 2015-01-25 DIAGNOSIS — M2141 Flat foot [pes planus] (acquired), right foot: Secondary | ICD-10-CM | POA: Diagnosis not present

## 2015-01-25 DIAGNOSIS — M79673 Pain in unspecified foot: Secondary | ICD-10-CM

## 2015-01-25 NOTE — Progress Notes (Signed)
Patient ID: NOSSON WENDER, male   DOB: [birth year], 39 y.o.   MRN: [id number]  Subjective: 39 year old male presents the office today with concerns of bilateral foot pain. He states he has had 2 pairs of orthotics previously needs if they help first however the last year history this is not helping as much. He continues to have pain in the arch of his feet as well as the inside portion of his ankle. He denies any recent injury or trauma. His his his been ongoing for several years. Denies any swelling or redness. No numbness or treatment. The pain does not wake him up at night. No other complaints at this time in no acute changes since last appointment.  Objective: AAO 3, NAD Neurovascular status intact There is a significant decrease in medial arch height upon weightbearing. The distally there is tenderness to palpation along the medial band of the plantar fascia along the arch of the foot. There is also slight discomfort along the course of posterior tibial tendon inferior to the medial malleolus. There is mild decrease in range of motion subtalar joint. Ankle joint range of motion is intact. No specific area pinpoint bony tenderness and there is no pain with vibratory sensation. MMT 5/5, ROM WNL. There is no overlying edema, erythema, increase in warmth to bilateral lower extremity's. No pain with calf compression, swelling, warmth, erythema. No open lesions or pre-ulcerative lesions are identified bilaterally. Upon evaluation of the orthotics they do not appear to be fitting well they're not providing much support.  Assessment: 38 year old male with severe flatfoot deformity, PTTD  Plan: -Treatment options discussed including all alternatives, risks, and complications -I do believe that he would he benefit from another set of orthotics which would hopefully provide him with more support. He agrees to this. He was scanned for orthotics today and they were sent to [facility] labs. -Continue with  supportive shoes at all times.  -Follow-up after orthotics or sooner if any problems arise. In the meantime, encouraged to call the office with any questions, concerns, change in symptoms.   Ovid Curd, DPM

## 2015-02-22 ENCOUNTER — Encounter: Payer: Self-pay | Admitting: Podiatry

## 2015-02-22 ENCOUNTER — Ambulatory Visit (INDEPENDENT_AMBULATORY_CARE_PROVIDER_SITE_OTHER): Payer: 59 | Admitting: Podiatry

## 2015-02-22 DIAGNOSIS — M79673 Pain in unspecified foot: Secondary | ICD-10-CM

## 2015-02-22 DIAGNOSIS — M76829 Posterior tibial tendinitis, unspecified leg: Secondary | ICD-10-CM

## 2015-02-22 DIAGNOSIS — M722 Plantar fascial fibromatosis: Secondary | ICD-10-CM

## 2015-02-22 NOTE — Progress Notes (Signed)
Patient ID: Chase Carey, male   DOB: [birth year], 39 y.o.   MRN: [id number]  Patient presents today for pickup orthotics. They were dispensed to him he is provided with written and oral break-in instructions. He had no new complaints today in no acute changes since last appointment. Follow-up in 4 weeks or sooner any problems are to arise. In the meantime occurred 6 call the office with any questions, concerns, change in symptoms.

## 2015-02-22 NOTE — Patient Instructions (Signed)

## 2015-02-28 ENCOUNTER — Ambulatory Visit (INDEPENDENT_AMBULATORY_CARE_PROVIDER_SITE_OTHER): Payer: 59 | Admitting: Internal Medicine

## 2015-02-28 ENCOUNTER — Encounter: Payer: Self-pay | Admitting: Internal Medicine

## 2015-02-28 VITALS — BP 118/72 | HR 76 | Temp 97.9°F | Resp 12 | Ht 75.5 in | Wt 236.5 lb

## 2015-02-28 DIAGNOSIS — Z0000 Encounter for general adult medical examination without abnormal findings: Secondary | ICD-10-CM | POA: Diagnosis not present

## 2015-02-28 DIAGNOSIS — N359 Urethral stricture, unspecified: Secondary | ICD-10-CM | POA: Diagnosis not present

## 2015-02-28 DIAGNOSIS — N3944 Nocturnal enuresis: Secondary | ICD-10-CM

## 2015-02-28 DIAGNOSIS — R350 Frequency of micturition: Secondary | ICD-10-CM

## 2015-02-28 DIAGNOSIS — Z125 Encounter for screening for malignant neoplasm of prostate: Secondary | ICD-10-CM | POA: Diagnosis not present

## 2015-02-28 DIAGNOSIS — Z113 Encounter for screening for infections with a predominantly sexual mode of transmission: Secondary | ICD-10-CM | POA: Diagnosis not present

## 2015-02-28 DIAGNOSIS — E785 Hyperlipidemia, unspecified: Secondary | ICD-10-CM | POA: Diagnosis not present

## 2015-02-28 DIAGNOSIS — Z23 Encounter for immunization: Secondary | ICD-10-CM

## 2015-02-28 DIAGNOSIS — N35919 Unspecified urethral stricture, male, unspecified site: Secondary | ICD-10-CM | POA: Insufficient documentation

## 2015-02-28 DIAGNOSIS — Z9884 Bariatric surgery status: Secondary | ICD-10-CM

## 2015-02-28 LAB — LIPID PANEL
CHOL/HDL RATIO: 3
Cholesterol: 156 mg/dL (ref 0–200)
HDL: 57.6 mg/dL (ref >39.00)
LDL CALC: 87 mg/dL (ref 0–99)
NONHDL: 98.77
TRIGLYCERIDES: 57 mg/dL (ref 0.0–149.0)
VLDL: 11.4 mg/dL (ref 0.0–40.0)

## 2015-02-28 LAB — URINALYSIS, ROUTINE W REFLEX MICROSCOPIC
Bilirubin Urine: NEGATIVE
Hgb urine dipstick: NEGATIVE
Ketones, ur: NEGATIVE
Leukocytes, UA: NEGATIVE
Nitrite: NEGATIVE
PH: 7 (ref 5.0–8.0)
RBC / HPF: NONE SEEN (ref 0–?)
SPECIFIC GRAVITY, URINE: 1.02 (ref 1.000–1.030)
TOTAL PROTEIN, URINE-UPE24: NEGATIVE
Urine Glucose: NEGATIVE
Urobilinogen, UA: 1 (ref 0.0–1.0)
WBC, UA: NONE SEEN (ref 0–?)

## 2015-02-28 LAB — PSA: PSA: 0.41 ng/mL (ref 0.10–4.00)

## 2015-02-28 NOTE — Addendum Note (Signed)
Addended by: Sherlene Shams on: 02/28/2015 09:54 PM   Modules accepted: Level of Service, SmartSet

## 2015-02-28 NOTE — Progress Notes (Signed)
Patient ID: Chase Carey, male    DOB: 1975-10-13  Age: 39 y.o. MRN: [id number]  The patient is here for annual  wellness examination and management of other chronic and acute problems.   The risk factors are reflected in the social history.  The roster of all physicians providing medical care to patient - is listed in the Snapshot section of the chart.  Home safety : The patient has smoke detectors in the home. They wear seatbelts.  There are no firearms at home. There is no violence in the home.   There is no risks for hepatitis, STDs or HIV. There is no   history of blood transfusion. They have no travel history to infectious disease endemic areas of the world.  The patient has seen their dentist in the last six month. They have seen their eye doctor in the last year. They admit to slight hearing difficulty with regard to whispered voices and some television programs.  They have deferred audiologic testing in the last year.  They do not  have excessive sun exposure. Discussed the need for sun protection: hats, long sleeves and use of sunscreen if there is significant sun exposure.   Diet: the importance of a healthy diet is discussed. They do have a healthy diet.  The benefits of regular aerobic exercise were discussed. She walks 4 times per week ,  20 minutes.   Depression screen: there are no signs or vegative symptoms of depression- irritability, change in appetite, anhedonia, Cognitive assessment: the patient manages all their financial and personal affairs and is actively engaged. They could relate day,date,year and events; recalled 2/3 objects at 3 minutes; performed clock-face test normally.  The following portions of the patient's history were reviewed and updated as appropriate: allergies, current medications, past family history, past medical history,  past surgical history, past social history  and problem list.'   Visual acuity was not assessed per patient preference since she  has regular follow up with her ophthalmologist. Hearing and body mass index were assessed and reviewed. Duriing the course of the visit the patient was educated and counseled about appropriate screening and preventive services including : fall prevention , diabetes screening, nutrition counseling, colorectal cancer screening, and recommended immunizations.    CC: The primary encounter diagnosis was Nocturnal enuresis. Diagnoses of Screening for STD (sexually transmitted disease), Prostate cancer screening, Increased urinary frequency, Hyperlipidemia, Encounter for immunization, Routine general medical examination at a health care facility, S/P gastric bypass, and Stricture of male urethra, unspecified stricture type were also pertinent to this visit.   1)  He has been having intermittent discomfort in the Left trapezius muscle that  Radiates down his left arm to the 5th finger with numb feeling.  Occurs after working out his upper body.  He denies constant neck pain.  Sleeps on his back, does not use a cervical support pillow.  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   2) Occasional nocturnal incontinence occurring once a week or once every 2 weeks .  Not a heavy sleeper .  Does not soak the bed or pajamas .No excessive nocturia. . Has also been having daytime urinary frequency and altered stream (deviates)  But denies Peyronie's symptoms.    History Nicasio has a past medical history of Obesity, unspecified; Anal fissure; Asymptomatic varicose veins; Psychogenic pain, site unspecified; Elevated blood pressure reading without diagnosis of hypertension; Plantar fascial fibromatosis;  Seborrhea capitis; Other dyspnea and respiratory abnormality; Sprain of neck; Cervicalgia; Balanoposthitis; Anemia, unspecified; Other abnormal blood chemistry; Lumbago; Localized superficial swelling, mass, or lump; Sleep apnea; Gallstones; Chronic headaches;  Arthritis; GERD (gastroesophageal reflux disease); and Tuberculosis.   He has past surgical history that includes Gastric bypass (10/17/2009); RLE varicose vein laser procedure (2011); Hemorrhoidectomy with hemorrhoid banding (2011); Vasectomy (02/25/2013); and Vein ligation and stripping (Right, 04/07/13).   His family history includes Other in an other family member. He was adopted.He reports that he has never smoked. He has never used smokeless tobacco. He reports that he does not drink alcohol or use illicit drugs.  Outpatient Prescriptions Prior to Visit  Medication Sig Dispense Refill  . buPROPion (WELLBUTRIN) 75 MG tablet Take 1 tablet (75 mg total) by mouth 2 (two) times daily. 60 tablet 2  . calcium carbonate (OS-CAL) 600 MG TABS Take 600 mg by mouth 2 (two) times daily with a meal.    . cholecalciferol (VITAMIN D) 1000 UNITS tablet Take 3,000 Units by mouth daily.     . clobetasol (OLUX) 0.05 % topical foam Apply topically 2 (two) times daily.    Marland Kitchen EPINEPHrine (EPIPEN 2-PAK) 0.3 mg/0.3 mL IJ SOAJ injection Inject 0.3 mLs (0.3 mg total) into the muscle once. 1 Device 1  . esomeprazole (NEXIUM) 40 MG capsule Take 1 capsule (40 mg total) by mouth daily. 30 capsule 3  . hyoscyamine (LEVSIN SL) 0.125 MG SL tablet Place 1 tablet (0.125 mg total) under the tongue every 4 (four) hours as needed. 30 tablet 0  . Multiple Vitamin (MULTIVITAMINS PO) Take by mouth daily.    . Multiple Vitamins-Minerals (ZINC PO) Take 1 tablet by mouth daily.    Marland Kitchen nystatin (MYCOSTATIN) powder Apply topically 2 (two) times daily. 15 g 3  . Probiotic Product (PROBIOTIC DAILY PO) Take by mouth daily.    . vitamin B-12 (CYANOCOBALAMIN) 100 MCG tablet Take 100 mcg by mouth daily.     No facility-administered medications prior to visit.    Review of Systems   Patient denies headache, fevers, malaise, unintentional weight loss, skin rash, eye pain, sinus congestion and sinus pain, sore throat, dysphagia,  hemoptysis ,  cough, dyspnea, wheezing, chest pain, palpitations, orthopnea, edema, abdominal pain, nausea, melena, diarrhea, constipation, flank pain, dysuria, hematuria,  numbness, tingling, seizures,  Focal weakness, Loss of consciousness,  Tremor, insomnia, depression, anxiety, and suicidal ideation.      Objective:  BP 118/72 mmHg  Pulse 76  Temp(Src) 97.9 F (36.6 C) (Oral)  Resp 12  Ht 6' 3.5" (1.918 m)  Wt 236 lb 8 oz (107.276 kg)  BMI 29.16 kg/m2  SpO2 98%  Physical Exam   General appearance: alert, cooperative and appears stated age Ears: normal TM's and external ear canals both ears Throat: lips, mucosa, and tongue normal; teeth and gums normal Neck: no adenopathy, no carotid bruit, supple, symmetrical, trachea midline and thyroid not enlarged, symmetric, no tenderness/mass/nodules Back: symmetric, no curvature. ROM normal. No CVA tenderness. Lungs: clear to auscultation bilaterally Heart: regular rate and rhythm, S1, S2 normal, no murmur, click, rub or gallop Abdomen: soft, non-tender; bowel sounds normal; no masses,  no organomegaly Pulses: 2+ and symmetric Skin: Skin color, texture, turgor normal. No rashes or lesions Lymph nodes: Cervical, supraclavicular, and axillary nodes normal.    Assessment & Plan:   Problem List Items Addressed This Visit      Unprioritized   Routine general medical examination at a health care facility  Annual comprehensive preventive exam was done as well as an evaluation and management of chronic conditions .  During the course of the visit the patient was educated and counseled about appropriate screening and preventive services including :  diabetes screening, lipid analysis with projected  10 year  risk for CAD   , nutrition counseling, colorectal cancer screening, and recommended immunizations.  Printed recommendations for health maintenance screenings was given.       S/P gastric bypass    Screening for vitamin deficiencies to be done.  He  continues to lose weight .      Nocturnal enuresis - Primary     UA negative for infection .  Referral to Urology for evaluation of prostate       Relevant Orders   POCT urinalysis dipstick   Urinalysis, Routine w reflex microscopic (not at [facility]) (Completed)   Urine culture   Ambulatory referral to Urology   Screening for STD (sexually transmitted disease)   Relevant Orders   HIV antibody   Prostate cancer screening   Relevant Orders   PSA (Completed)   Increased urinary frequency   Relevant Orders   POCT urinalysis dipstick   Urinalysis, Routine w reflex microscopic (not at [facility]) (Completed)   Urine culture   Urethral stricture    Suggested by diversion of urinary stream/Urology referral advised.        Other Visit Diagnoses    Hyperlipidemia        Relevant Orders    Lipid panel (Completed)    Encounter for immunization           I am having Mr. Aust maintain his vitamin B-12, calcium carbonate, Multiple Vitamin (MULTIVITAMINS PO), clobetasol, cholecalciferol, Multiple Vitamins-Minerals (ZINC PO), hyoscyamine, esomeprazole, Probiotic Product (PROBIOTIC DAILY PO), EPINEPHrine, nystatin, and buPROPion.  No orders of the defined types were placed in this encounter.    There are no discontinued medications.  Follow-up: No Follow-up on file.   Sherlene Shams, MD

## 2015-02-28 NOTE — Assessment & Plan Note (Signed)
Annual comprehensive preventive exam was done as well as an evaluation and management of chronic conditions .  During the course of the visit the patient was educated and counseled about appropriate screening and preventive services including :  diabetes screening, lipid analysis with projected  10 year  risk for CAD , nutrition counseling, colorectal cancer screening, and recommended immunizations.  Printed recommendations for health maintenance screenings was given.   

## 2015-02-28 NOTE — Progress Notes (Signed)
Pre-visit discussion using our clinic review tool. No additional management support is needed unless otherwise documented below in the visit note.  

## 2015-02-28 NOTE — Patient Instructions (Signed)
I recommend a daily supplement that includes Vitamin D (800 IUs daily) and B12 or B complex given your borderline low levels and history of gastric bypass,  Which increases your risk for poor absorption  Referral to [facility] for your urinary issues which may be due to a urethral stricture or enarged prostate  Health Maintenance A healthy lifestyle and preventative care can promote health and wellness.  Maintain regular health, dental, and eye exams.  Eat a healthy diet. Foods like vegetables, fruits, whole grains, low-fat dairy products, and lean protein foods contain the nutrients you need and are low in calories. Decrease your intake of foods high in solid fats, added sugars, and salt. Get information about a proper diet from your health care provider, if necessary.  Regular physical exercise is one of the most important things you can do for your health. Most adults should get at least 150 minutes of moderate-intensity exercise (any activity that increases your heart rate and causes you to sweat) each week. In addition, most adults need muscle-strengthening exercises on 2 or more days a week.   Maintain a healthy weight. The body mass index (BMI) is a screening tool to identify possible weight problems. It provides an estimate of body fat based on height and weight. Your health care provider can find your BMI and can help you achieve or maintain a healthy weight. For males 20 years and older:  A BMI below 18.5 is considered underweight.  A BMI of 18.5 to 24.9 is normal.  A BMI of 25 to 29.9 is considered overweight.  A BMI of 30 and above is considered obese.  Maintain normal blood lipids and cholesterol by exercising and minimizing your intake of saturated fat. Eat a balanced diet with plenty of fruits and vegetables. Blood tests for lipids and cholesterol should begin at age 10 and be repeated every 5 years. If your lipid or cholesterol levels are high, you are  over age 25, or you are at high risk for heart disease, you may need your cholesterol levels checked more frequently.Ongoing high lipid and cholesterol levels should be treated with medicines if diet and exercise are not working.  If you smoke, find out from your health care provider how to quit. If you do not use tobacco, do not start.  Lung cancer screening is recommended for adults aged 55-80 years who are at high risk for developing lung cancer because of a history of smoking. A yearly low-dose CT scan of the lungs is recommended for people who have at least a 30-pack-year history of smoking and are current smokers or have quit within the past 15 years. A pack year of smoking is smoking an average of 1 pack of cigarettes a day for 1 year (for example, a 30-pack-year history of smoking could mean smoking 1 pack a day for 30 years or 2 packs a day for 15 years). Yearly screening should continue until the smoker has stopped smoking for at least 15 years. Yearly screening should be stopped for people who develop a health problem that would prevent them from having lung cancer treatment.  If you choose to drink alcohol, do not have more than 2 drinks per day. One drink is considered to be 12 oz (360 mL) of beer, 5 oz (150 mL) of wine, or 1.5 oz (45 mL) of liquor.  Avoid the use of street drugs. Do not share needles with anyone. Ask for help if you need support or instructions about  stopping the use of drugs.  High blood pressure causes heart disease and increases the risk of stroke. Blood pressure should be checked at least every 1-2 years. Ongoing high blood pressure should be treated with medicines if weight loss and exercise are not effective.  If you are 18-10 years old, ask your health care provider if you should take aspirin to prevent heart disease.  Diabetes screening involves taking a blood sample to check your fasting blood sugar level. This should be done once every 3 years after age 6 if  you are at a normal weight and without risk factors for diabetes. Testing should be considered at a younger age or be carried out more frequently if you are overweight and have at least 1 risk factor for diabetes.  Colorectal cancer can be detected and often prevented. Most routine colorectal cancer screening begins at the age of 33 and continues through age 37. However, your health care provider may recommend screening at an earlier age if you have risk factors for colon cancer. On a yearly basis, your health care provider may provide home test kits to check for hidden blood in the stool. A small camera at the end of a tube may be used to directly examine the colon (sigmoidoscopy or colonoscopy) to detect the earliest forms of colorectal cancer. Talk to your health care provider about this at age 34 when routine screening begins. A direct exam of the colon should be repeated every 5-10 years through age 49, unless early forms of precancerous polyps or small growths are found.  People who are at an increased risk for hepatitis B should be screened for this virus. You are considered at high risk for hepatitis B if:  You were born in a country where hepatitis B occurs often. Talk with your health care provider about which countries are considered high risk.  Your parents were born in a high-risk country and you have not received a shot to protect against hepatitis B (hepatitis B vaccine).  You have HIV or AIDS.  You use needles to inject street drugs.  You live with, or have sex with, someone who has hepatitis B.  You are a man who has sex with other men (MSM).  You get hemodialysis treatment.  You take certain medicines for conditions like cancer, organ transplantation, and autoimmune conditions.  Hepatitis C blood testing is recommended for all people born from [birth year] through 1967 and any individual with known risk factors for hepatitis C.  Healthy men should no longer receive prostate-specific  antigen (PSA) blood tests as part of routine cancer screening. Talk to your health care provider about prostate cancer screening.  Testicular cancer screening is not recommended for adolescents or adult males who have no symptoms. Screening includes self-exam, a health care provider exam, and other screening tests. Consult with your health care provider about any symptoms you have or any concerns you have about testicular cancer.  Practice safe sex. Use condoms and avoid high-risk sexual practices to reduce the spread of sexually transmitted infections (STIs).  You should be screened for STIs, including gonorrhea and chlamydia if:  You are sexually active and are younger than 24 years.  You are older than 24 years, and your health care provider tells you that you are at risk for this type of infection.  Your sexual activity has changed since you were last screened, and you are at an increased risk for chlamydia or gonorrhea. Ask your health care provider if you are  at risk.  If you are at risk of being infected with HIV, it is recommended that you take a prescription medicine daily to prevent HIV infection. This is called pre-exposure prophylaxis (PrEP). You are considered at risk if:  You are a man who has sex with other men (MSM).  You are a heterosexual man who is sexually active with multiple partners.  You take drugs by injection.  You are sexually active with a partner who has HIV.  Talk with your health care provider about whether you are at high risk of being infected with HIV. If you choose to begin PrEP, you should first be tested for HIV. You should then be tested every 3 months for as long as you are taking PrEP.  Use sunscreen. Apply sunscreen liberally and repeatedly throughout the day. You should seek shade when your shadow is shorter than you. Protect yourself by wearing long sleeves, pants, a wide-brimmed hat, and sunglasses year round whenever you are outdoors.  Tell  your health care provider of new moles or changes in moles, especially if there is a change in shape or color. Also, tell your health care provider if a mole is larger than the size of a pencil eraser.  A one-time screening for abdominal aortic aneurysm (AAA) and surgical repair of large AAAs by ultrasound is recommended for men aged 65-75 years who are current or former smokers.  Stay current with your vaccines (immunizations). Document Released: 12/06/2007 Document Revised: 06/14/2013 Document Reviewed: 11/04/2010 [facility] Patient Information 2015 [place], [place]. This information is not intended to replace advice given to you by your health care provider. Make sure you discuss any questions you have with your health care provider.

## 2015-02-28 NOTE — Assessment & Plan Note (Signed)
UA negative for infection .  Referral to Urology for evaluation of prostate

## 2015-02-28 NOTE — Assessment & Plan Note (Signed)
Suggested by diversion of urinary stream/Urology referral advised.

## 2015-02-28 NOTE — Assessment & Plan Note (Signed)
Screening for vitamin deficiencies to be done.  He continues to lose weight .

## 2015-03-01 LAB — URINE CULTURE: Colony Count: 30000

## 2015-03-01 LAB — HIV ANTIBODY (ROUTINE TESTING W REFLEX): HIV 1&2 Ab, 4th Generation: NONREACTIVE

## 2015-03-02 ENCOUNTER — Encounter: Payer: Self-pay | Admitting: Internal Medicine

## 2015-03-04 ENCOUNTER — Encounter: Payer: Self-pay | Admitting: Internal Medicine

## 2015-03-20 NOTE — Telephone Encounter (Signed)
Mailed unread message to patient.  

## 2015-03-28 ENCOUNTER — Telehealth: Payer: Self-pay | Admitting: *Deleted

## 2015-03-28 NOTE — Telephone Encounter (Signed)
Patient called stating that his orthotics are hurting there is not enough cushion in the forefoot.  Told patient to bring orthotics into office and we will send them back to add additional cushioning.

## 2015-04-05 DIAGNOSIS — R3913 Splitting of urinary stream: Secondary | ICD-10-CM | POA: Insufficient documentation

## 2015-04-08 ENCOUNTER — Encounter: Payer: Self-pay | Admitting: Internal Medicine

## 2015-04-09 ENCOUNTER — Other Ambulatory Visit: Payer: Self-pay

## 2015-04-09 MED ORDER — BUPROPION HCL 75 MG PO TABS: 75.0000 mg | ORAL_TABLET | Freq: Two times a day (BID) | ORAL | Status: DC

## 2015-04-09 NOTE — Telephone Encounter (Signed)
Patient dropped off orthotics today and sent them back to [facility] for extra cushioning. Should go out in mail tomorrow.

## 2015-04-29 DIAGNOSIS — R109 Unspecified abdominal pain: Secondary | ICD-10-CM | POA: Insufficient documentation

## 2015-04-29 DIAGNOSIS — R1010 Upper abdominal pain, unspecified: Secondary | ICD-10-CM | POA: Insufficient documentation

## 2015-04-29 DIAGNOSIS — K458 Other specified abdominal hernia without obstruction or gangrene: Secondary | ICD-10-CM | POA: Insufficient documentation

## 2015-04-29 DIAGNOSIS — Z9049 Acquired absence of other specified parts of digestive tract: Secondary | ICD-10-CM | POA: Insufficient documentation

## 2015-06-20 ENCOUNTER — Other Ambulatory Visit: Payer: Self-pay | Admitting: Internal Medicine

## 2015-06-20 ENCOUNTER — Encounter: Payer: Self-pay | Admitting: Internal Medicine

## 2015-06-20 MED ORDER — BUPROPION HCL ER (XL) 150 MG PO TB24: 150.0000 mg | ORAL_TABLET | Freq: Every day | ORAL | Status: DC

## 2015-07-04 DIAGNOSIS — K432 Incisional hernia without obstruction or gangrene: Secondary | ICD-10-CM | POA: Diagnosis not present

## 2015-07-04 DIAGNOSIS — K219 Gastro-esophageal reflux disease without esophagitis: Secondary | ICD-10-CM | POA: Diagnosis not present

## 2015-07-04 DIAGNOSIS — K561 Intussusception: Secondary | ICD-10-CM | POA: Diagnosis not present

## 2015-07-04 DIAGNOSIS — K439 Ventral hernia without obstruction or gangrene: Secondary | ICD-10-CM | POA: Diagnosis not present

## 2015-07-04 DIAGNOSIS — K660 Peritoneal adhesions (postprocedural) (postinfection): Secondary | ICD-10-CM | POA: Diagnosis not present

## 2015-07-04 DIAGNOSIS — Z79899 Other long term (current) drug therapy: Secondary | ICD-10-CM | POA: Diagnosis not present

## 2015-07-04 DIAGNOSIS — Z9884 Bariatric surgery status: Secondary | ICD-10-CM | POA: Diagnosis not present

## 2015-07-24 DIAGNOSIS — Z9884 Bariatric surgery status: Secondary | ICD-10-CM | POA: Diagnosis not present

## 2015-07-24 DIAGNOSIS — Z8719 Personal history of other diseases of the digestive system: Secondary | ICD-10-CM | POA: Diagnosis not present

## 2015-07-24 DIAGNOSIS — Z9889 Other specified postprocedural states: Secondary | ICD-10-CM | POA: Diagnosis not present

## 2015-08-24 ENCOUNTER — Encounter: Payer: Self-pay | Admitting: Internal Medicine

## 2015-08-24 DIAGNOSIS — H52223 Regular astigmatism, bilateral: Secondary | ICD-10-CM | POA: Diagnosis not present

## 2015-08-24 DIAGNOSIS — H524 Presbyopia: Secondary | ICD-10-CM | POA: Diagnosis not present

## 2015-08-24 DIAGNOSIS — H5213 Myopia, bilateral: Secondary | ICD-10-CM | POA: Diagnosis not present

## 2015-08-24 MED ORDER — OMEPRAZOLE 20 MG PO CPDR: 20.0000 mg | DELAYED_RELEASE_CAPSULE | Freq: Every day | ORAL | Status: DC

## 2015-08-24 MED FILL — OMEPRAZOLE DR 20 MG CAPSULE: 20 | 30 days supply | Qty: 30 | Fill #0

## 2015-09-17 MED FILL — BUPROPION HCL XL 150 MG TAB: 150 | 30 days supply | Qty: 30 | Fill #0

## 2015-09-17 MED FILL — TAMSULOSIN HCL 0.4 MG CAP: 0.4 | 30 days supply | Qty: 60 | Fill #0

## 2015-10-15 ENCOUNTER — Encounter: Payer: Self-pay | Admitting: Internal Medicine

## 2015-10-16 NOTE — Telephone Encounter (Signed)
Looks like she ordered a GI Pathogen Panel in care everywhere, so would be the same kit we use for C-diff, but you would have to put in the order unless he can take the kit to [facility] because I cannot print off the order from care everywhere. Dr. April Manson Denice Paradise

## 2015-10-17 ENCOUNTER — Encounter: Payer: Self-pay | Admitting: Internal Medicine

## 2015-10-18 DIAGNOSIS — Z9884 Bariatric surgery status: Secondary | ICD-10-CM | POA: Diagnosis not present

## 2015-10-18 DIAGNOSIS — R1084 Generalized abdominal pain: Secondary | ICD-10-CM | POA: Diagnosis not present

## 2015-10-25 ENCOUNTER — Other Ambulatory Visit: Payer: Self-pay

## 2015-10-25 MED ORDER — BUPROPION HCL ER (XL) 150 MG PO TB24
150.0000 mg | ORAL_TABLET | Freq: Every day | ORAL | Status: DC
Start: 2015-10-25 — End: 2016-02-01

## 2015-10-25 MED FILL — BUPROPION HCL XL 150 MG TAB: 150 | 30 days supply | Qty: 30 | Fill #0

## 2015-10-25 MED FILL — XOLEGEL 2% GEL: 2 | 30 days supply | Qty: 45 | Fill #1

## 2015-11-21 MED FILL — BUPROPION HCL XL 150 MG TAB: 150 | 30 days supply | Qty: 30 | Fill #1

## 2015-11-21 MED FILL — TAMSULOSIN HCL 0.4 MG CAP: 0.4 | 30 days supply | Qty: 60 | Fill #1

## 2015-12-13 ENCOUNTER — Telehealth: Payer: Self-pay

## 2015-12-13 ENCOUNTER — Encounter: Payer: Self-pay | Admitting: Family Medicine

## 2015-12-13 ENCOUNTER — Ambulatory Visit
Admission: RE | Admit: 2015-12-13 | Discharge: 2015-12-13 | Disposition: A | Discharge status: Home / Self Care | Payer: 59 | Source: Ambulatory Visit | Attending: Family Medicine | Admitting: Family Medicine

## 2015-12-13 ENCOUNTER — Ambulatory Visit (INDEPENDENT_AMBULATORY_CARE_PROVIDER_SITE_OTHER): Payer: 59 | Admitting: Family Medicine

## 2015-12-13 VITALS — BP 133/84 | HR 101 | Temp 97.7°F | Ht 75.5 in | Wt 253.4 lb

## 2015-12-13 DIAGNOSIS — I83812 Varicose veins of left lower extremities with pain: Secondary | ICD-10-CM | POA: Insufficient documentation

## 2015-12-13 DIAGNOSIS — M79605 Pain in left leg: Secondary | ICD-10-CM | POA: Diagnosis not present

## 2015-12-13 NOTE — Assessment & Plan Note (Signed)
Patient with chronic history of varicose veins. Has had some new onset pain and slight increase in size and varicose veins in left lower extremity. Suspect this is related to his varicose veins though given slight increase in size and pain we'll obtain an ultrasound to ensure there is no blood clot. He has no chest pain, shortness breath, or palpitations to indicate VTE. Oxygenation is normal. Minimally tachycardic and suspect this is likely related to discomfort. Discussed treatment for DVT and also superficial phlebitis and varicose veins. Will determine treatment after ultrasound is completed today. He is given return precautions.

## 2015-12-13 NOTE — Telephone Encounter (Signed)
Call report  [facility] stated that patient does not have a DVT but he does have Thrombus superficial varicosities in left leg..Chase Carey

## 2015-12-13 NOTE — Telephone Encounter (Signed)
Notified patient of US results. Patient verbalized understanding. He will try the low dose ibuprofen if pain gets worse. I advised if he starts having upset stomach or acid reflux to discontinue ibuprofen. He already has appointment with vascular surgery in August, but will call if he needs it sooner.

## 2015-12-13 NOTE — Progress Notes (Signed)
Pre visit review using our clinic review tool, if applicable. No additional management support is needed unless otherwise documented below in the visit note. 

## 2015-12-13 NOTE — Telephone Encounter (Signed)
Please inform the patient that he has thrombosed superficial varicose veins that are the cause of his pain. He does not have a DVT. No need for blood thinners. He should use warm or cool compresses on the area. If discomfort is intolerable despite compresses he could try a low dose of ibuprofen though I would try to avoid this with his history of gastric bypass. Given issue with varicose veins it may be worth while to refer back to vascular surgery if he is willing.

## 2015-12-13 NOTE — Progress Notes (Signed)
Patient ID: Chase Carey, male   DOB: 1975/09/28, 40 y.o.   MRN: [id number]  [name], MD Phone: [phone]  Chase Carey is a 40 y.o. male who presents today for same-day visit.  Patient notes long history of varicose veins. States he's had them his whole adult life. Typically worse in the right leg. He had surgery on the right leg for this. For the last 2 weeks the varicose veins on the left leg lateral aspect have become slightly more puffy and have had some discomfort. He has not ever had discomfort in his varicose veins previously. No apparent swelling in his legs. No chest pain, shortness of breath, or palpitations. He has not taken any medications for this. He has no history of blood clot.  PMH: nonsmoker.   ROS see history of present illness  Objective  Physical Exam Filed Vitals:   12/13/15 1300  BP: 133/84  Pulse: 101  Temp: 97.7 F (36.5 C)    BP Readings from Last 3 Encounters:  12/13/15 133/84  02/28/15 118/72  01/25/15 119/82   Wt Readings from Last 3 Encounters:  12/13/15 253 lb 6.4 oz (114.941 kg)  02/28/15 236 lb 8 oz (107.276 kg)  01/25/15 240 lb (108.863 kg)    Physical Exam  Constitutional: He is well-developed, well-nourished, and in no distress.  HENT:  Head: Normocephalic and atraumatic.  Right Ear: External ear normal.  Left Ear: External ear normal.  Cardiovascular: Regular rhythm and normal heart sounds.   Tachycardic  Pulmonary/Chest: Effort normal and breath sounds normal.  Neurological: He is alert. Gait normal.  Skin: Skin is warm and dry. He is not diaphoretic.  Left leg with varicose veins lateral aspect lower leg mildly tender with no evidence of thrombosis on palpation, no calf tenderness or swelling, right leg with varicose veins on medial and lateral aspect with minimal distal swelling the patient reports is stable, no calf tenderness or swelling, no cords bilaterally     Assessment/Plan: Please see individual problem  list.  Varicose veins with pain Patient with chronic history of varicose veins. Has had some new onset pain and slight increase in size and varicose veins in left lower extremity. Suspect this is related to his varicose veins though given slight increase in size and pain we'll obtain an ultrasound to ensure there is no blood clot. He has no chest pain, shortness breath, or palpitations to indicate VTE. Oxygenation is normal. Minimally tachycardic and suspect this is likely related to discomfort. Discussed treatment for DVT and also superficial phlebitis and varicose veins. Will determine treatment after ultrasound is completed today. He is given return precautions.    Orders Placed This Encounter  Procedures  . US Venous Img Lower Unilateral Left    Standing Status: Future     Number of Occurrences:      Standing Expiration Date: 02/11/2017    Order Specific Question:  Reason for Exam (SYMPTOM  OR DIAGNOSIS REQUIRED)    Answer:  left lower leg increased varicose veins and pain in varicose veins for last 1-2 weeks    Order Specific Question:  Preferred imaging location?    Answer:  [facility]    Marikay AlarEric Shaniqua Guillot, MD [facility] [place]

## 2015-12-13 NOTE — Patient Instructions (Signed)
Nice to meet you. We're going to obtain an ultrasound of her left leg to ensure that there is no blood clot. If you develop chest pain, shortness of breath, palpitations, or any new or changing symptoms please seek medical attention.

## 2015-12-18 DIAGNOSIS — I83893 Varicose veins of bilateral lower extremities with other complications: Secondary | ICD-10-CM | POA: Diagnosis not present

## 2015-12-26 MED FILL — BUPROPION HCL XL 150 MG TAB: 150 | 30 days supply | Qty: 30 | Fill #2

## 2015-12-26 MED FILL — TAMSULOSIN HCL 0.4 MG CAP: 0.4 | 30 days supply | Qty: 60 | Fill #2

## 2016-01-04 ENCOUNTER — Other Ambulatory Visit: Payer: Self-pay

## 2016-01-04 MED ORDER — EPINEPHRINE 0.3 MG/0.3ML IJ SOAJ: 0.3000 mg | Freq: Once | INTRAMUSCULAR | Status: DC

## 2016-01-04 NOTE — Telephone Encounter (Signed)
Medication refill

## 2016-01-22 MED FILL — CLOBETASOL EMOLLNT 0.05% FO: 0.05 | 30 days supply | Qty: 100 | Fill #1

## 2016-01-25 ENCOUNTER — Ambulatory Visit: Payer: 59 | Admitting: Internal Medicine

## 2016-01-28 ENCOUNTER — Ambulatory Visit (INDEPENDENT_AMBULATORY_CARE_PROVIDER_SITE_OTHER): Payer: 59 | Admitting: Family Medicine

## 2016-01-28 DIAGNOSIS — M545 Low back pain, unspecified: Secondary | ICD-10-CM | POA: Insufficient documentation

## 2016-01-28 MED ORDER — PREDNISONE 10 MG (21) PO TBPK: ORAL_TABLET | ORAL | 0 refills | Status: DC

## 2016-01-28 NOTE — Progress Notes (Signed)
Pre visit review using our clinic review tool, if applicable. No additional management support is needed unless otherwise documented below in the visit note. 

## 2016-01-28 NOTE — Progress Notes (Signed)
Subjective:  Patient ID: Chase Carey, male    DOB: 08/05/1975  Age: 40 y.o. MRN: [id number]  CC: Back pain  HPI:  40 year old male presents for an acute visit with complaints of back pain.  Patient reports a 3 week history of low back pain. Bilateral and in the center of the back. He reports he's had radiation down his legs bilaterally. Worse with standing, bending, and other activity. No relieving factors. It has recently started to improve over the past few days. No medications or other interventions tried. He does not recall any trauma, fall, injury. No other complaints at this time.  Social Hx   Social History   Social History  . Marital status: Married    Spouse name: N/A  . Number of children: 2  . Years of education: college   Occupational History  . [employer]    Social History Main Topics  . Smoking status: Never Smoker  . Smokeless tobacco: Never Used  . Alcohol use No     Comment: occasional  1 - 2 times per year wine  . Drug use: No  . Sexual activity: Not on file   Other Topics Concern  . Not on file   Social History Narrative   Unknown family history. Adopted. Married x 10.5 years, happily married. Caffeine use: Coffee, 2 servings per day. Guns in the home stored in locked cabinet, loaded. No formal exercise program.    Review of Systems  Constitutional: Negative.   Musculoskeletal: Positive for back pain.   Objective:  BP 134/83 (BP Location: Left Arm, Patient Position: Sitting, Cuff Size: Normal)   Pulse 81   Temp 98.1 F (36.7 C) (Oral)   Wt 261 lb 4 oz (118.5 kg)   SpO2 100%   BMI 32.22 kg/m   BP/Weight 01/28/2016 12/13/2015 02/28/2015  Systolic BP 134 133 118  Diastolic BP 83 84 72  Wt. (Lbs) 261.25 253.4 236.5  BMI 32.22 31.24 29.16   Physical Exam  Constitutional: He is oriented to person, place, and time. He appears well-developed. No distress.  Cardiovascular: Normal rate and regular rhythm.   Pulmonary/Chest: Effort  normal. He has no wheezes. He has no rales.  Musculoskeletal:  Low back - nontender to palpation. Negative straight leg raise.  Neurological: He is alert and oriented to person, place, and time.  Psychiatric: He has a normal mood and affect.  Vitals reviewed.   Lab Results  Component Value Date   WBC 5.6 02/22/2014   HGB 12.1 (L) 02/22/2014   HCT 36.9 (L) 02/22/2014   PLT 186.0 02/22/2014   GLUCOSE 58 (L) 12/12/2014   CHOL 156 02/28/2015   TRIG 57.0 02/28/2015   HDL 57.60 02/28/2015   LDLCALC 87 02/28/2015   ALT 14 12/12/2014   AST 27 12/12/2014   NA 137 12/12/2014   K 3.9 12/12/2014   CL 104 12/12/2014   CREATININE 0.74 12/12/2014   BUN 16 12/12/2014   CO2 26 12/12/2014   TSH 1.510 12/12/2014   PSA 0.41 02/28/2015   HGBA1C 4.9 09/24/2012    Assessment & Plan:   Problem List Items Addressed This Visit    Low back pain    New acute problem. MSK in origin.  Treating with steroid taper (cannot take NSAID's given gastric bypass).      Relevant Medications   predniSONE (STERAPRED UNI-PAK 21 TAB) 10 MG (21) TBPK tablet    Other Visit Diagnoses   None.     Meds  ordered this encounter  Medications  . predniSONE (STERAPRED UNI-PAK 21 TAB) 10 MG (21) TBPK tablet    Sig: 6 tablets on day 1, then decrease by 1 tablet daily until gone.    Dispense:  21 tablet    Refill:  0    Follow-up: PRN  Everlene Other DO [facility]

## 2016-01-28 NOTE — Assessment & Plan Note (Signed)
New acute problem. MSK in origin.  Treating with steroid taper (cannot take NSAID's given gastric bypass).

## 2016-01-28 NOTE — Patient Instructions (Signed)
Take the medication as prescribed (6,5,4,3,2,1).  Follow up as needed.  Take care  Dr. Adriana Simasook

## 2016-02-01 ENCOUNTER — Other Ambulatory Visit: Payer: Self-pay

## 2016-02-01 MED ORDER — BUPROPION HCL ER (XL) 150 MG PO TB24
150.0000 mg | ORAL_TABLET | Freq: Every day | ORAL | 1 refills | Status: DC
Start: 2016-02-01 — End: 2016-04-29

## 2016-02-01 MED FILL — BUPROPION HCL XL 150 MG TAB: 150 | 30 days supply | Qty: 30 | Fill #0

## 2016-02-01 NOTE — Telephone Encounter (Signed)
medication refilled.

## 2016-02-05 MED FILL — TAMSULOSIN HCL 0.4 MG CAP: 0.4 | 30 days supply | Qty: 60 | Fill #0

## 2016-02-14 ENCOUNTER — Encounter: Payer: Self-pay | Admitting: Internal Medicine

## 2016-02-29 MED FILL — BUPROPION HCL XL 150 MG TAB: 150 | 30 days supply | Qty: 30 | Fill #1

## 2016-02-29 MED FILL — TAMSULOSIN HCL 0.4 MG CAP: 0.4 | 30 days supply | Qty: 60 | Fill #1 | Status: TO

## 2016-03-23 HISTORY — PX: HERNIA REPAIR: SHX51

## 2016-03-31 ENCOUNTER — Encounter: Payer: Self-pay | Admitting: Internal Medicine

## 2016-03-31 ENCOUNTER — Ambulatory Visit (INDEPENDENT_AMBULATORY_CARE_PROVIDER_SITE_OTHER): Payer: 59 | Admitting: Internal Medicine

## 2016-03-31 ENCOUNTER — Ambulatory Visit (INDEPENDENT_AMBULATORY_CARE_PROVIDER_SITE_OTHER): Payer: 59

## 2016-03-31 VITALS — BP 120/78 | HR 75 | Temp 98.1°F | Resp 12 | Ht 75.5 in | Wt 270.5 lb

## 2016-03-31 DIAGNOSIS — Z125 Encounter for screening for malignant neoplasm of prostate: Secondary | ICD-10-CM

## 2016-03-31 DIAGNOSIS — Z0000 Encounter for general adult medical examination without abnormal findings: Secondary | ICD-10-CM | POA: Diagnosis not present

## 2016-03-31 DIAGNOSIS — E559 Vitamin D deficiency, unspecified: Secondary | ICD-10-CM | POA: Diagnosis not present

## 2016-03-31 DIAGNOSIS — E6609 Other obesity due to excess calories: Secondary | ICD-10-CM

## 2016-03-31 DIAGNOSIS — F411 Generalized anxiety disorder: Secondary | ICD-10-CM

## 2016-03-31 DIAGNOSIS — I83819 Varicose veins of unspecified lower extremities with pain: Secondary | ICD-10-CM | POA: Diagnosis not present

## 2016-03-31 DIAGNOSIS — R7301 Impaired fasting glucose: Secondary | ICD-10-CM

## 2016-03-31 DIAGNOSIS — R5383 Other fatigue: Secondary | ICD-10-CM | POA: Diagnosis not present

## 2016-03-31 DIAGNOSIS — E7800 Pure hypercholesterolemia, unspecified: Secondary | ICD-10-CM | POA: Diagnosis not present

## 2016-03-31 DIAGNOSIS — E66811 Obesity, class 1: Secondary | ICD-10-CM

## 2016-03-31 DIAGNOSIS — M25562 Pain in left knee: Secondary | ICD-10-CM

## 2016-03-31 DIAGNOSIS — R41840 Attention and concentration deficit: Secondary | ICD-10-CM

## 2016-03-31 DIAGNOSIS — Z6833 Body mass index (BMI) 33.0-33.9, adult: Secondary | ICD-10-CM

## 2016-03-31 LAB — COMPREHENSIVE METABOLIC PANEL
ALBUMIN: 4 g/dL (ref 3.5–5.2)
ALT: 15 U/L (ref 0–53)
AST: 31 U/L (ref 0–37)
Alkaline Phosphatase: 59 U/L (ref 39–117)
BUN: 14 mg/dL (ref 6–23)
CALCIUM: 9.1 mg/dL (ref 8.4–10.5)
CHLORIDE: 103 meq/L (ref 96–112)
CO2: 29 meq/L (ref 19–32)
Creatinine, Ser: 0.85 mg/dL (ref 0.40–1.50)
GFR: 127.03 mL/min (ref >60.00)
GLUCOSE: 96 mg/dL (ref 70–99)
POTASSIUM: 3.8 meq/L (ref 3.5–5.1)
SODIUM: 138 meq/L (ref 135–145)
Total Bilirubin: 0.5 mg/dL (ref 0.2–1.2)
Total Protein: 6.9 g/dL (ref 6.0–8.3)

## 2016-03-31 LAB — CBC WITH DIFFERENTIAL/PLATELET
BASOS ABS: 0 10*3/uL (ref 0.0–0.1)
BASOS PCT: 0.6 % (ref 0.0–3.0)
EOS ABS: 0.1 10*3/uL (ref 0.0–0.7)
Eosinophils Relative: 1.5 % (ref 0.0–5.0)
HEMATOCRIT: 34.8 % — AB (ref 39.0–52.0)
Hemoglobin: 11.2 g/dL — ABNORMAL LOW (ref 13.0–17.0)
LYMPHS ABS: 2.2 10*3/uL (ref 0.7–4.0)
Lymphocytes Relative: 35 % (ref 12.0–46.0)
MCHC: 32.3 g/dL (ref 30.0–36.0)
MCV: 93.3 fl (ref 78.0–100.0)
Monocytes Absolute: 0.5 10*3/uL (ref 0.1–1.0)
Monocytes Relative: 8 % (ref 3.0–12.0)
NEUTROS ABS: 3.4 10*3/uL (ref 1.4–7.7)
NEUTROS PCT: 54.9 % (ref 43.0–77.0)
PLATELETS: 249 10*3/uL (ref 150.0–400.0)
RBC: 3.73 Mil/uL — ABNORMAL LOW (ref 4.22–5.81)
RDW: 14.3 % (ref 11.5–15.5)
WBC: 6.2 10*3/uL (ref 4.0–10.5)

## 2016-03-31 LAB — LIPID PANEL
CHOL/HDL RATIO: 3
CHOLESTEROL: 205 mg/dL — AB (ref 0–200)
HDL: 74 mg/dL (ref >39.00)
LDL CALC: 113 mg/dL — AB (ref 0–99)
NonHDL: 131.07
TRIGLYCERIDES: 90 mg/dL (ref 0.0–149.0)
VLDL: 18 mg/dL (ref 0.0–40.0)

## 2016-03-31 LAB — PSA: PSA: 0.54 ng/mL (ref 0.10–4.00)

## 2016-03-31 LAB — VITAMIN D 25 HYDROXY (VIT D DEFICIENCY, FRACTURES): VITD: 33.18 ng/mL (ref 30.00–100.00)

## 2016-03-31 LAB — HEMOGLOBIN A1C: Hgb A1c MFr Bld: 5.1 % (ref 4.6–6.5)

## 2016-03-31 LAB — VITAMIN B12: Vitamin B-12: 271 pg/mL (ref 211–911)

## 2016-03-31 MED ORDER — ESCITALOPRAM OXALATE 10 MG PO TABS: 10.0000 mg | ORAL_TABLET | Freq: Every day | ORAL | 5 refills | Status: DC

## 2016-03-31 MED FILL — ESCITALOPRAM 10 MG TABLET: 10 | 30 days supply | Qty: 30 | Fill #0

## 2016-03-31 NOTE — Progress Notes (Signed)
Patient ID: Chase Carey, male    DOB: 1975-12-01  Age: 40 y.o. MRN: [id number]  The patient is here for annual wellness examination and management of other chronic and acute problems.      The risk factors are reflected in the social history.  The roster of all physicians providing medical care to patient - is listed in the Snapshot section of the chart.  Home safety : The patient has smoke detectors in the home. They wear seatbelts.  There are no firearms at home. There is no violence in the home.   There is no risks for hepatitis, STDs or HIV. There is no   history of blood transfusion. They have no travel history to infectious disease endemic areas of the world.  The patient has seen their dentist in the last six month. They have seen their eye doctor in the last year. . Discussed the need for sun protection: hats, long sleeves and use of sunscreen if there is significant sun exposure.   Diet: the importance of a healthy diet is discussed. He has gained 9 lbs since his last visit .  The benefits of regular aerobic exercise were discussed. Chase Carey is not exercising regularly because of a recent move,  But plans to start riding his bike.    Depression screen: there are recurrent signs of depression and anxiety- irritability, change in appetite, anhedonia.  He is still taking his wellbutrin.   The following portions of the patient's history were reviewed and updated as appropriate: allergies, current medications, past family history, past medical history,  past surgical history, past social history  and problem list.  Visual acuity was not assessed per patient preference since she has regular follow up with her ophthalmologist. Hearing and body mass index were assessed and reviewed.   During the course of the visit the patient was educated and counseled about appropriate screening and preventive services including : fall prevention , diabetes screening, nutrition counseling, colorectal cancer  screening, and recommended immunizations.    CC: The primary encounter diagnosis was Fatigue, unspecified type. Diagnoses of Vitamin D deficiency, Pure hypercholesterolemia, Impaired fasting glucose, Prostate cancer screening, Acute pain of left knee, Class 1 obesity due to excess calories without serious comorbidity with body mass index (BMI) of 33.0 to 33.9 in adult, Varicose veins with pain, Encounter for preventive health examination, Concentration deficit, Generalized anxiety disorder, and Left lateral knee pain were also pertinent to this visit.   Last seen August 2017  for low back pain  With radiculopathy to both legs.  Improving by time of evaluation by JC , was treated  with steroid taper. Used to happen frequently prior toweight loss via  bariatric surgery.    Has had 3 episodes in the last year, has gained 40 lbs since his bariatirc surgery in 2011/    LE pain in June by ES.  Superficial varciosities were thrombosed in left lateral upper calf but  DVT ruled out with ultrasound. Referred to vascular in August for treatment of varicose veins. Ablation planned in both legs.    Having cramping and swelling during the day.  Not wearing compression stockings but walks a lot during the day   Left knee became swollen and tight 3 weeks ago on the,lateral side.  Had been climbing more stairs and carrying heavy boxes during residential move.  Using ice .  Feels like it may buckle when he first gets up ,  And hurts more going down stairs.   No  falls.    Weight gain.  S/p gastric bypass June 2011;  Nadir was 231 lbs June 2015, BMI 28.13   Now  270 lbs BMI 33.4   Has not been exercising  Regularly or eating properly because of the residential move.  Diet reviewed.  Cereal for breakfast. Raisin bran, omelette,  Has not tried may protein shakes   History Chase Carey has a past medical history of Anal fissure; Anemia, unspecified; Arthritis; Asymptomatic varicose veins; Balanoposthitis; Cervicalgia; Chronic  headaches; Elevated blood pressure reading without diagnosis of hypertension; Gallstones; GERD (gastroesophageal reflux disease); Localized superficial swelling, mass, or lump; Lumbago; Obesity, unspecified; Other abnormal blood chemistry; Other dyspnea and respiratory abnormality; Plantar fascial fibromatosis; Psychogenic pain, site unspecified; Seborrhea capitis; Sleep apnea; Sprain of neck; and Tuberculosis.   He has a past surgical history that includes Gastric bypass (10/17/2009); RLE varicose vein laser procedure (2011); Hemorrhoidectomy with hemorrhoid banding (2011); Vasectomy (02/25/2013); and Vein ligation and stripping (Right, 04/07/13).   His family history is not on file. He was adopted.He reports that he has never smoked. He has never used smokeless tobacco. He reports that he does not drink alcohol or use drugs.  Outpatient Medications Prior to Visit  Medication Sig Dispense Refill  . buPROPion (WELLBUTRIN XL) 150 MG 24 hr tablet Take 1 tablet (150 mg total) by mouth daily. 30 tablet 1  . calcium carbonate (OS-CAL) 600 MG TABS Take 600 mg by mouth 2 (two) times daily with a meal.    . cholecalciferol (VITAMIN D) 1000 UNITS tablet Take 3,000 Units by mouth daily.     . clobetasol (OLUX) 0.05 % topical foam Apply topically 2 (two) times daily.    Marland Kitchen. EPINEPHrine (EPIPEN 2-PAK) 0.3 mg/0.3 mL IJ SOAJ injection Inject 0.3 mLs (0.3 mg total) into the muscle once. 1 Device 1  . Multiple Vitamin (MULTIVITAMINS PO) Take by mouth daily.    . Multiple Vitamins-Minerals (ZINC PO) Take 1 tablet by mouth daily.    Marland Kitchen. nystatin (MYCOSTATIN) powder Apply topically 2 (two) times daily. 15 g 3  . omeprazole (PRILOSEC) 20 MG capsule Take 1 capsule (20 mg total) by mouth daily. 30 capsule 3  . Probiotic Product (PROBIOTIC DAILY PO) Take by mouth daily.    . vitamin B-12 (CYANOCOBALAMIN) 100 MCG tablet Take 100 mcg by mouth daily.    Marland Kitchen. esomeprazole (NEXIUM) 40 MG capsule Take 1 capsule (40 mg total) by mouth  daily. (Patient not taking: Reported on 03/31/2016) 30 capsule 3  . hyoscyamine (LEVSIN SL) 0.125 MG SL tablet Place 1 tablet (0.125 mg total) under the tongue every 4 (four) hours as needed. (Patient not taking: Reported on 03/31/2016) 30 tablet 0  . predniSONE (STERAPRED UNI-PAK 21 TAB) 10 MG (21) TBPK tablet 6 tablets on day 1, then decrease by 1 tablet daily until gone. (Patient not taking: Reported on 03/31/2016) 21 tablet 0   No facility-administered medications prior to visit.     Review of Systems   Patient denies headache, fevers, malaise, unintentional weight loss, skin rash, eye pain, sinus congestion and sinus pain, sore throat, dysphagia,  hemoptysis , cough, dyspnea, wheezing, chest pain, palpitations, orthopnea, edema, abdominal pain, nausea, melena, diarrhea, constipation, flank pain, dysuria, hematuria, urinary  Frequency, nocturia, numbness, tingling, seizures,  Focal weakness, Loss of consciousness,  Tremor, insomnia, depression, anxiety, and suicidal ideation.      Objective:  BP 120/78   Pulse 75   Temp 98.1 F (36.7 C) (Oral)   Resp 12   Ht  6' 3.5" (1.918 m)   Wt 270 lb 8 oz (122.7 kg)   SpO2 99%   BMI 33.36 kg/m   Physical Exam   General appearance: alert, cooperative and appears stated age Ears: normal TM's and external ear canals both ears Throat: lips, mucosa, and tongue normal; teeth and gums normal Neck: no adenopathy, no carotid bruit, supple, symmetrical, trachea midline and thyroid not enlarged, symmetric, no tenderness/mass/nodules Back: symmetric, no curvature. ROM normal. No CVA tenderness. Lungs: clear to auscultation bilaterally Heart: regular rate and rhythm, S1, S2 normal, no murmur, click, rub or gallop Abdomen: soft, non-tender; bowel sounds normal; no masses,  no organomegaly Pulses: 2+ and symmetric MSK: left knee : no edema,  Some crepitus.  Skin: Skin color, texture, turgor normal. No rashes or lesions Lymph nodes: Cervical,  supraclavicular, and axillary nodes normal.    Assessment & Plan:   Problem List Items Addressed This Visit    Obesity    Discussed his weight gain of 40 lbs and current Body mass index is 33.36 kg/m. I have addressed  BMI and recommended a low glycemic index diet utilizing smaller more frequent meals to increase metabolism.  I have also recommended that patient start exercising with a goal of 30 minutes of aerobic exercise a minimum of 5 days per week. Screening for lipid disorders, thyroid and diabetes to be done today.         Varicose veins with pain    For ablation , left leg , in the near future.       Encounter for preventive health examination    Annual comprehensive preventive exam was done as well as an evaluation and management of acute and chronic conditions .  During the course of the visit the patient was educated and counseled about appropriate screening and preventive services including :  diabetes screening, lipid analysis with projected  10 year  risk for CAD , nutrition counseling, prostate and colorectal cancer screening, and recommended immunizations.  Printed recommendations for health maintenance screenings was given.       Concentration deficit    Continue wellbutrin.        Generalized anxiety disorder    Symptoms have been previously managed with wellbutrin currently endorsing irritability,  Lack of interest in things,  Continue wellbutrin,  Adding lexapro       Left lateral knee pain    History of giving way suggestive of meniscal tear with crepitus (mild) noted on exam..  Plain films ordered.  Avoiding NSAIDs on a daily basis given history of gastric bypass.       Prostate cancer screening   Relevant Orders   PSA (Completed)    Other Visit Diagnoses    Fatigue, unspecified type    -  Primary   Relevant Orders   CBC with Differential/Platelet (Completed)   B12 (Completed)   Methylmalonic Acid   Comprehensive metabolic panel (Completed)    Vitamin D deficiency       Relevant Orders   VITAMIN D 25 Hydroxy (Vit-D Deficiency, Fractures) (Completed)   Pure hypercholesterolemia       Relevant Orders   Lipid panel (Completed)   Impaired fasting glucose       Relevant Orders   Hemoglobin A1c (Completed)   Acute pain of left knee       Relevant Orders   DG Knee 3 Views Left (Completed)      I am having Mr. Milke start on escitalopram. I am also having him maintain  his vitamin B-12, calcium carbonate, Multiple Vitamin (MULTIVITAMINS PO), clobetasol, cholecalciferol, Multiple Vitamins-Minerals (ZINC PO), hyoscyamine, esomeprazole, Probiotic Product (PROBIOTIC DAILY PO), nystatin, omeprazole, EPINEPHrine, predniSONE, and buPROPion.  Meds ordered this encounter  Medications  . escitalopram (LEXAPRO) 10 MG tablet    Sig: Take 1 tablet (10 mg total) by mouth daily.    Dispense:  30 tablet    Refill:  5    There are no discontinued medications.  Follow-up: No Follow-up on file.   Sherlene Shams, MD

## 2016-03-31 NOTE — Progress Notes (Signed)
Pre-visit discussion using our clinic review tool. No additional management support is needed unless otherwise documented below in the visit note.  

## 2016-03-31 NOTE — Patient Instructions (Addendum)
You might want to try a premixed protein drink called Premier Protein shake for breakfast or late night snack . It is great tasting,   very low sugar and available of < $2 serving at [facility] and  In bulk for $1.50/serving at [employer] and [employer]  .    Nutritional analysis :  160 cal  30 g protein  1 g sugar 50% calcium needs   Nicolette Bang has the best variety of flavors,  But the best  Prices are on the cases of vanilla and chocolate and [employer]  Try the chips made from Black beans callled Beanito's .  high fiber,  So lower carb.   Adding Lexparo to help improve your mood .  Continue wellbutrin      Health Maintenance, Male A healthy lifestyle and preventative care can promote health and wellness.  Maintain regular health, dental, and eye exams.  Eat a healthy diet. Foods like vegetables, fruits, whole grains, low-fat dairy products, and lean protein foods contain the nutrients you need and are low in calories. Decrease your intake of foods high in solid fats, added sugars, and salt. Get information about a proper diet from your health care provider, if necessary.  Regular physical exercise is one of the most important things you can do for your health. Most adults should get at least 150 minutes of moderate-intensity exercise (any activity that increases your heart rate and causes you to sweat) each week. In addition, most adults need muscle-strengthening exercises on 2 or more days a week.   Maintain a healthy weight. The body mass index (BMI) is a screening tool to identify possible weight problems. It provides an estimate of body fat based on height and weight. Your health care provider can find your BMI and can help you achieve or maintain a healthy weight. For males 20 years and older:  A BMI below 18.5 is considered underweight.  A BMI of 18.5 to 24.9 is normal.  A BMI of 25 to 29.9 is considered overweight.  A BMI of 30 and above is considered obese.  Maintain normal blood lipids  and cholesterol by exercising and minimizing your intake of saturated fat. Eat a balanced diet with plenty of fruits and vegetables. Blood tests for lipids and cholesterol should begin at age 60 and be repeated every 5 years. If your lipid or cholesterol levels are high, you are over age 17, or you are at high risk for heart disease, you may need your cholesterol levels checked more frequently.Ongoing high lipid and cholesterol levels should be treated with medicines if diet and exercise are not working.  If you smoke, find out from your health care provider how to quit. If you do not use tobacco, do not start.  Lung cancer screening is recommended for adults aged 55-80 years who are at high risk for developing lung cancer because of a history of smoking. A yearly low-dose CT scan of the lungs is recommended for people who have at least a 30-pack-year history of smoking and are current smokers or have quit within the past 15 years. A pack year of smoking is smoking an average of 1 pack of cigarettes a day for 1 year (for example, a 30-pack-year history of smoking could mean smoking 1 pack a day for 30 years or 2 packs a day for 15 years). Yearly screening should continue until the smoker has stopped smoking for at least 15 years. Yearly screening should be stopped for people who develop  a health problem that would prevent them from having lung cancer treatment.  If you choose to drink alcohol, do not have more than 2 drinks per day. One drink is considered to be 12 oz (360 mL) of beer, 5 oz (150 mL) of wine, or 1.5 oz (45 mL) of liquor.  Avoid the use of street drugs. Do not share needles with anyone. Ask for help if you need support or instructions about stopping the use of drugs.  High blood pressure causes heart disease and increases the risk of stroke. High blood pressure is more likely to develop in:  People who have blood pressure in the end of the normal range (100-139/85-89 mm Hg).  People who  are overweight or obese.  People who are African American.  If you are 109-9 years of age, have your blood pressure checked every 3-5 years. If you are 107 years of age or older, have your blood pressure checked every year. You should have your blood pressure measured twice--once when you are at a hospital or clinic, and once when you are not at a hospital or clinic. Record the average of the two measurements. To check your blood pressure when you are not at a hospital or clinic, you can use:  An automated blood pressure machine at a pharmacy.  A home blood pressure monitor.  If you are 1-5 years old, ask your health care provider if you should take aspirin to prevent heart disease.  Diabetes screening involves taking a blood sample to check your fasting blood sugar level. This should be done once every 3 years after age 57 if you are at a normal weight and without risk factors for diabetes. Testing should be considered at a younger age or be carried out more frequently if you are overweight and have at least 1 risk factor for diabetes.  Colorectal cancer can be detected and often prevented. Most routine colorectal cancer screening begins at the age of 13 and continues through age 16. However, your health care provider may recommend screening at an earlier age if you have risk factors for colon cancer. On a yearly basis, your health care provider may provide home test kits to check for hidden blood in the stool. A small camera at the end of a tube may be used to directly examine the colon (sigmoidoscopy or colonoscopy) to detect the earliest forms of colorectal cancer. Talk to your health care provider about this at age 55 when routine screening begins. A direct exam of the colon should be repeated every 5-10 years through age 18, unless early forms of precancerous polyps or small growths are found.  People who are at an increased risk for hepatitis B should be screened for this virus. You are  considered at high risk for hepatitis B if:  You were born in a country where hepatitis B occurs often. Talk with your health care provider about which countries are considered high risk.  Your parents were born in a high-risk country and you have not received a shot to protect against hepatitis B (hepatitis B vaccine).  You have HIV or AIDS.  You use needles to inject street drugs.  You live with, or have sex with, someone who has hepatitis B.  You are a man who has sex with other men (MSM).  You get hemodialysis treatment.  You take certain medicines for conditions like cancer, organ transplantation, and autoimmune conditions.  Hepatitis C blood testing is recommended for all people born from  1945 through 1965 and any individual with known risk factors for hepatitis C.  Healthy men should no longer receive prostate-specific antigen (PSA) blood tests as part of routine cancer screening. Talk to your health care provider about prostate cancer screening.  Testicular cancer screening is not recommended for adolescents or adult males who have no symptoms. Screening includes self-exam, a health care provider exam, and other screening tests. Consult with your health care provider about any symptoms you have or any concerns you have about testicular cancer.  Practice safe sex. Use condoms and avoid high-risk sexual practices to reduce the spread of sexually transmitted infections (STIs).  You should be screened for STIs, including gonorrhea and chlamydia if:  You are sexually active and are younger than 24 years.  You are older than 24 years, and your health care provider tells you that you are at risk for this type of infection.  Your sexual activity has changed since you were last screened, and you are at an increased risk for chlamydia or gonorrhea. Ask your health care provider if you are at risk.  If you are at risk of being infected with HIV, it is recommended that you take a  prescription medicine daily to prevent HIV infection. This is called pre-exposure prophylaxis (PrEP). You are considered at risk if:  You are a man who has sex with other men (MSM).  You are a heterosexual man who is sexually active with multiple partners.  You take drugs by injection.  You are sexually active with a partner who has HIV.  Talk with your health care provider about whether you are at high risk of being infected with HIV. If you choose to begin PrEP, you should first be tested for HIV. You should then be tested every 3 months for as long as you are taking PrEP.  Use sunscreen. Apply sunscreen liberally and repeatedly throughout the day. You should seek shade when your shadow is shorter than you. Protect yourself by wearing long sleeves, pants, a wide-brimmed hat, and sunglasses year round whenever you are outdoors.  Tell your health care provider of new moles or changes in moles, especially if there is a change in shape or color. Also, tell your health care provider if a mole is larger than the size of a pencil eraser.  A one-time screening for abdominal aortic aneurysm (AAA) and surgical repair of large AAAs by ultrasound is recommended for men aged 65-75 years who are current or former smokers.  Stay current with your vaccines (immunizations).   This information is not intended to replace advice given to you by your health care provider. Make sure you discuss any questions you have with your health care provider.   Document Released: 12/06/2007 Document Revised: 06/30/2014 Document Reviewed: 11/04/2010 Elsevier Interactive Patient Education [employer]2016 Elsevier Inc.

## 2016-04-01 ENCOUNTER — Encounter: Payer: Self-pay | Admitting: Internal Medicine

## 2016-04-01 DIAGNOSIS — F411 Generalized anxiety disorder: Secondary | ICD-10-CM | POA: Insufficient documentation

## 2016-04-01 DIAGNOSIS — M25562 Pain in left knee: Secondary | ICD-10-CM | POA: Insufficient documentation

## 2016-04-01 NOTE — Assessment & Plan Note (Signed)

## 2016-04-01 NOTE — Assessment & Plan Note (Signed)
History of giving way suggestive of meniscal tear with crepitus (mild) noted on exam..  Plain films ordered.  Avoiding NSAIDs on a daily basis given history of gastric bypass.

## 2016-04-01 NOTE — Assessment & Plan Note (Signed)
Discussed his weight gain of 40 lbs and current Body mass index is 33.36 kg/m. I have addressed  BMI and recommended a low glycemic index diet utilizing smaller more frequent meals to increase metabolism.  I have also recommended that patient start exercising with a goal of 30 minutes of aerobic exercise a minimum of 5 days per week. Screening for lipid disorders, thyroid and diabetes to be done today.

## 2016-04-01 NOTE — Assessment & Plan Note (Signed)
Symptoms have been previously managed with wellbutrin currently endorsing irritability,  Lack of interest in things,  Continue wellbutrin,  Adding lexapro

## 2016-04-01 NOTE — Assessment & Plan Note (Signed)
Continue wellbutrin

## 2016-04-01 NOTE — Assessment & Plan Note (Signed)
For ablation , left leg , in the near future.

## 2016-04-02 ENCOUNTER — Encounter: Payer: Self-pay | Admitting: Internal Medicine

## 2016-04-02 ENCOUNTER — Other Ambulatory Visit: Payer: Self-pay | Admitting: Internal Medicine

## 2016-04-02 DIAGNOSIS — D649 Anemia, unspecified: Secondary | ICD-10-CM | POA: Insufficient documentation

## 2016-04-02 NOTE — Assessment & Plan Note (Signed)
Etiology unclear,  He has stopped taking Nexium.  b12 is low normal.  return to clinic  one month for additional labs.

## 2016-04-02 NOTE — Progress Notes (Signed)
cbc

## 2016-04-03 DIAGNOSIS — R1033 Periumbilical pain: Secondary | ICD-10-CM | POA: Diagnosis not present

## 2016-04-03 DIAGNOSIS — K9509 Other complications of gastric band procedure: Secondary | ICD-10-CM | POA: Diagnosis not present

## 2016-04-03 DIAGNOSIS — R1084 Generalized abdominal pain: Secondary | ICD-10-CM | POA: Diagnosis not present

## 2016-04-03 DIAGNOSIS — K56699 Other intestinal obstruction unspecified as to partial versus complete obstruction: Secondary | ICD-10-CM | POA: Diagnosis not present

## 2016-04-03 DIAGNOSIS — R110 Nausea: Secondary | ICD-10-CM | POA: Diagnosis not present

## 2016-04-03 DIAGNOSIS — R1032 Left lower quadrant pain: Secondary | ICD-10-CM | POA: Diagnosis not present

## 2016-04-03 DIAGNOSIS — K469 Unspecified abdominal hernia without obstruction or gangrene: Secondary | ICD-10-CM | POA: Diagnosis not present

## 2016-04-03 LAB — METHYLMALONIC ACID, SERUM: Methylmalonic Acid, Quant: 180 nmol/L (ref 87–318)

## 2016-04-04 DIAGNOSIS — R1084 Generalized abdominal pain: Secondary | ICD-10-CM | POA: Diagnosis not present

## 2016-04-04 DIAGNOSIS — K469 Unspecified abdominal hernia without obstruction or gangrene: Secondary | ICD-10-CM | POA: Diagnosis not present

## 2016-04-04 DIAGNOSIS — K450 Other specified abdominal hernia with obstruction, without gangrene: Secondary | ICD-10-CM | POA: Diagnosis not present

## 2016-04-04 DIAGNOSIS — Z9884 Bariatric surgery status: Secondary | ICD-10-CM | POA: Diagnosis not present

## 2016-04-04 DIAGNOSIS — K565 Intestinal adhesions [bands], unspecified as to partial versus complete obstruction: Secondary | ICD-10-CM | POA: Diagnosis not present

## 2016-04-04 DIAGNOSIS — K9509 Other complications of gastric band procedure: Secondary | ICD-10-CM | POA: Diagnosis not present

## 2016-04-04 DIAGNOSIS — R109 Unspecified abdominal pain: Secondary | ICD-10-CM | POA: Diagnosis not present

## 2016-04-04 DIAGNOSIS — K660 Peritoneal adhesions (postprocedural) (postinfection): Secondary | ICD-10-CM | POA: Diagnosis not present

## 2016-04-04 DIAGNOSIS — J9811 Atelectasis: Secondary | ICD-10-CM | POA: Diagnosis not present

## 2016-04-04 DIAGNOSIS — K458 Other specified abdominal hernia without obstruction or gangrene: Secondary | ICD-10-CM | POA: Diagnosis not present

## 2016-04-04 DIAGNOSIS — Z4682 Encounter for fitting and adjustment of non-vascular catheter: Secondary | ICD-10-CM | POA: Diagnosis not present

## 2016-04-04 DIAGNOSIS — R110 Nausea: Secondary | ICD-10-CM | POA: Diagnosis not present

## 2016-04-04 DIAGNOSIS — K219 Gastro-esophageal reflux disease without esophagitis: Secondary | ICD-10-CM | POA: Diagnosis not present

## 2016-04-08 DIAGNOSIS — R079 Chest pain, unspecified: Secondary | ICD-10-CM | POA: Diagnosis not present

## 2016-04-08 DIAGNOSIS — R12 Heartburn: Secondary | ICD-10-CM | POA: Diagnosis not present

## 2016-04-08 DIAGNOSIS — R1310 Dysphagia, unspecified: Secondary | ICD-10-CM | POA: Diagnosis not present

## 2016-04-08 DIAGNOSIS — E860 Dehydration: Secondary | ICD-10-CM | POA: Diagnosis not present

## 2016-04-08 DIAGNOSIS — R1114 Bilious vomiting: Secondary | ICD-10-CM | POA: Diagnosis not present

## 2016-04-08 DIAGNOSIS — Z9884 Bariatric surgery status: Secondary | ICD-10-CM | POA: Diagnosis not present

## 2016-04-08 DIAGNOSIS — R509 Fever, unspecified: Secondary | ICD-10-CM | POA: Diagnosis not present

## 2016-04-08 DIAGNOSIS — Z79891 Long term (current) use of opiate analgesic: Secondary | ICD-10-CM | POA: Diagnosis not present

## 2016-04-08 DIAGNOSIS — Z79899 Other long term (current) drug therapy: Secondary | ICD-10-CM | POA: Diagnosis not present

## 2016-04-08 DIAGNOSIS — K219 Gastro-esophageal reflux disease without esophagitis: Secondary | ICD-10-CM | POA: Diagnosis not present

## 2016-04-09 DIAGNOSIS — Z9884 Bariatric surgery status: Secondary | ICD-10-CM | POA: Diagnosis not present

## 2016-04-09 DIAGNOSIS — Z79891 Long term (current) use of opiate analgesic: Secondary | ICD-10-CM | POA: Diagnosis not present

## 2016-04-09 DIAGNOSIS — R140 Abdominal distension (gaseous): Secondary | ICD-10-CM | POA: Diagnosis not present

## 2016-04-09 DIAGNOSIS — R509 Fever, unspecified: Secondary | ICD-10-CM | POA: Diagnosis not present

## 2016-04-09 DIAGNOSIS — R0989 Other specified symptoms and signs involving the circulatory and respiratory systems: Secondary | ICD-10-CM | POA: Diagnosis not present

## 2016-04-09 DIAGNOSIS — R12 Heartburn: Secondary | ICD-10-CM | POA: Diagnosis not present

## 2016-04-09 DIAGNOSIS — R918 Other nonspecific abnormal finding of lung field: Secondary | ICD-10-CM | POA: Diagnosis not present

## 2016-04-09 DIAGNOSIS — E860 Dehydration: Secondary | ICD-10-CM | POA: Diagnosis not present

## 2016-04-09 DIAGNOSIS — K219 Gastro-esophageal reflux disease without esophagitis: Secondary | ICD-10-CM | POA: Diagnosis not present

## 2016-04-09 DIAGNOSIS — R1114 Bilious vomiting: Secondary | ICD-10-CM | POA: Diagnosis not present

## 2016-04-09 DIAGNOSIS — R1310 Dysphagia, unspecified: Secondary | ICD-10-CM | POA: Diagnosis not present

## 2016-04-09 DIAGNOSIS — Z79899 Other long term (current) drug therapy: Secondary | ICD-10-CM | POA: Diagnosis not present

## 2016-04-09 DIAGNOSIS — K458 Other specified abdominal hernia without obstruction or gangrene: Secondary | ICD-10-CM | POA: Diagnosis not present

## 2016-04-09 DIAGNOSIS — R079 Chest pain, unspecified: Secondary | ICD-10-CM | POA: Diagnosis not present

## 2016-04-14 ENCOUNTER — Ambulatory Visit: Payer: 59 | Admitting: Family Medicine

## 2016-04-17 DIAGNOSIS — I83893 Varicose veins of bilateral lower extremities with other complications: Secondary | ICD-10-CM | POA: Diagnosis not present

## 2016-04-22 DIAGNOSIS — Z9884 Bariatric surgery status: Secondary | ICD-10-CM | POA: Diagnosis not present

## 2016-04-29 ENCOUNTER — Other Ambulatory Visit: Payer: Self-pay | Admitting: Internal Medicine

## 2016-04-29 MED FILL — ESCITALOPRAM 10 MG TABLET: 10 | 30 days supply | Qty: 30 | Fill #1 | Status: TO

## 2016-05-05 IMAGING — CT CT ABD-PELV W/ CM
2 of 6 series · 14 of 46 positions shown, 18 images · IV contrast (Omni 300)
Comparison: Abdominal ultrasound 04/12/2013

CLINICAL DATA: Right-sided chest pain.  Right upper quadrant pain.

EXAM:
CT ABDOMEN AND PELVIS WITH CONTRAST
TECHNIQUE: Multidetector CT imaging of the abdomen and pelvis was performed
using the standard protocol following bolus administration of
intravenous contrast.
CONTRAST:  90mL OMNIPAQUE IOHEXOL 300 MG/ML  SOLN

[Series 2: abd/ pelvis 5.0 i30f 1 · axial · 0.86mm/px · z∈[+989,+1419]mm · 11 of 100 slices shown, 15 images]
[im 9/100  soft-tissue]
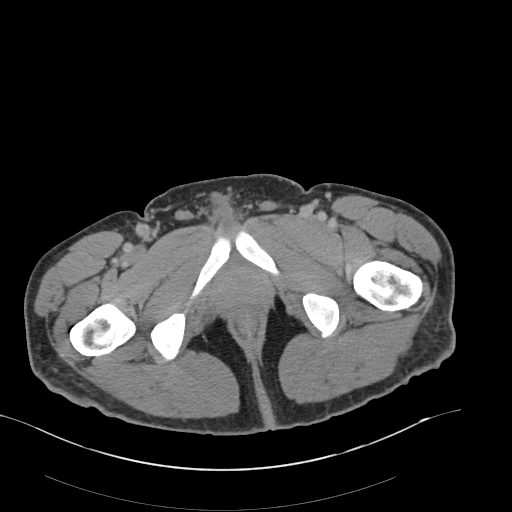
[im 9/100  bone]
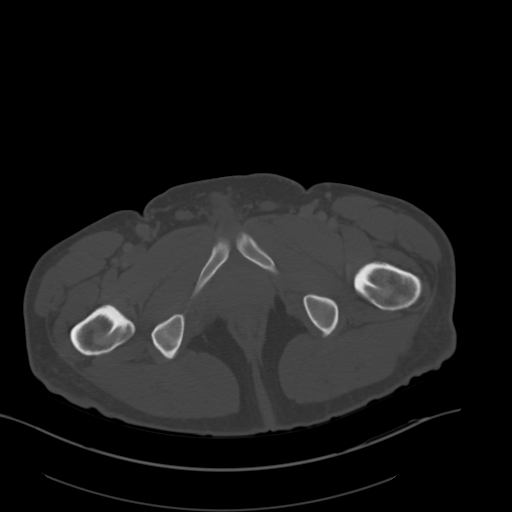
[im 17/100  soft-tissue]
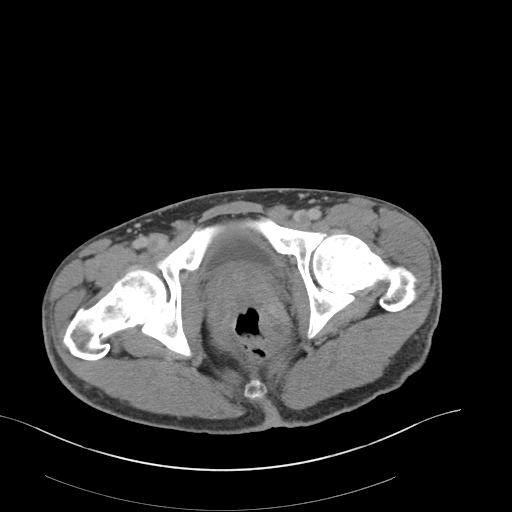
[im 29/100  soft-tissue]
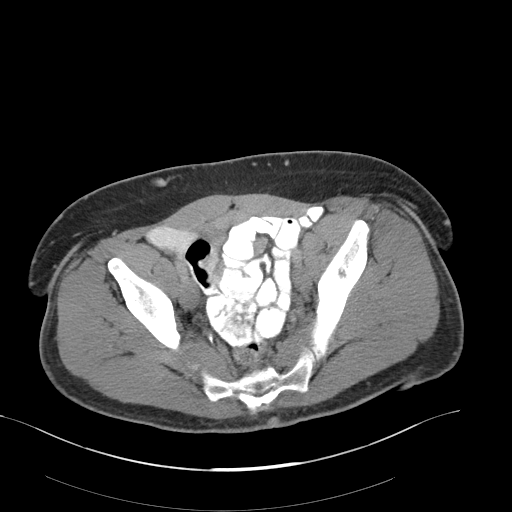
[im 38/100  soft-tissue]
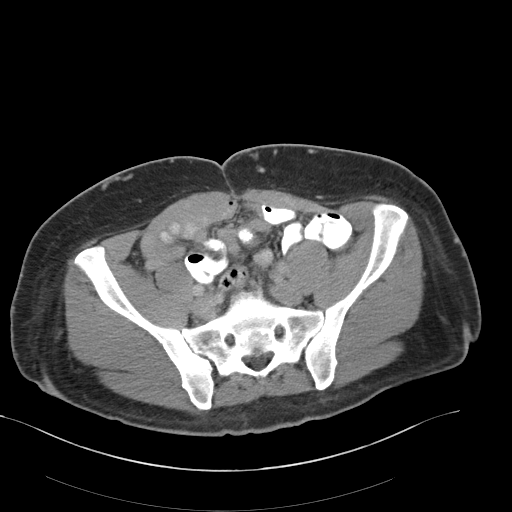
[im 50/100  soft-tissue]
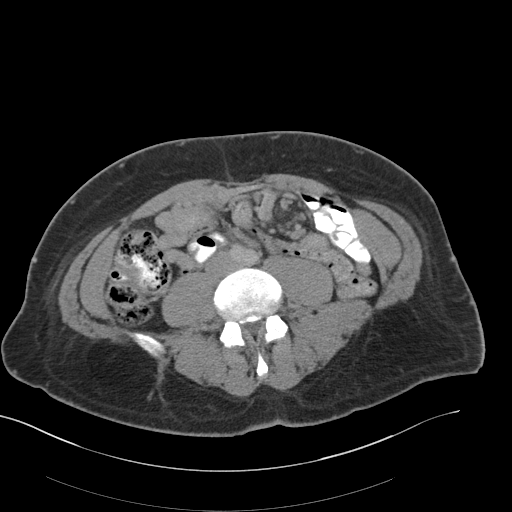
[im 62/100  soft-tissue]
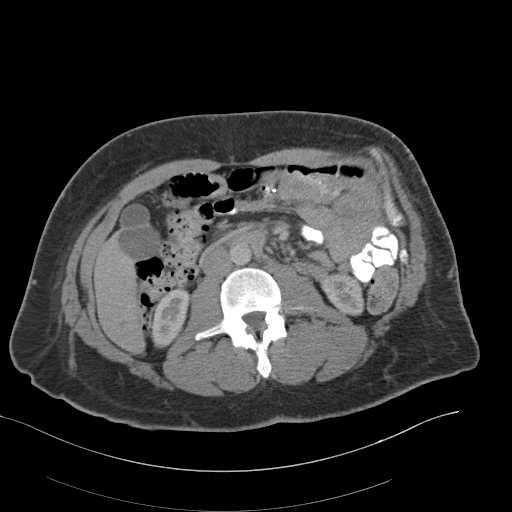
[im 71/100  soft-tissue]
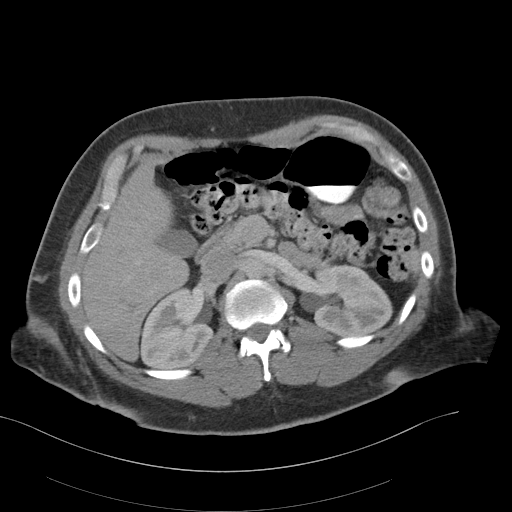
[im 83/100  soft-tissue]
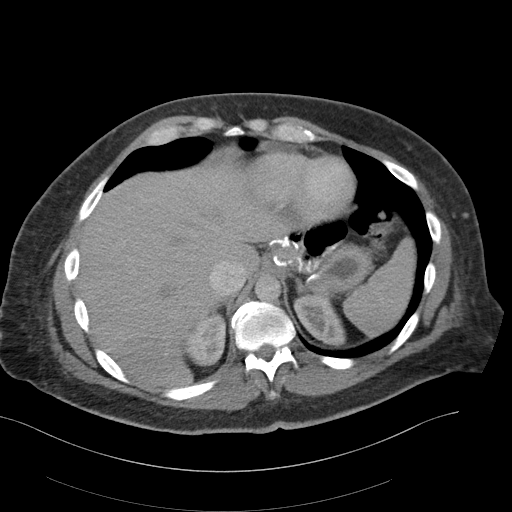
[im 83/100  lung]
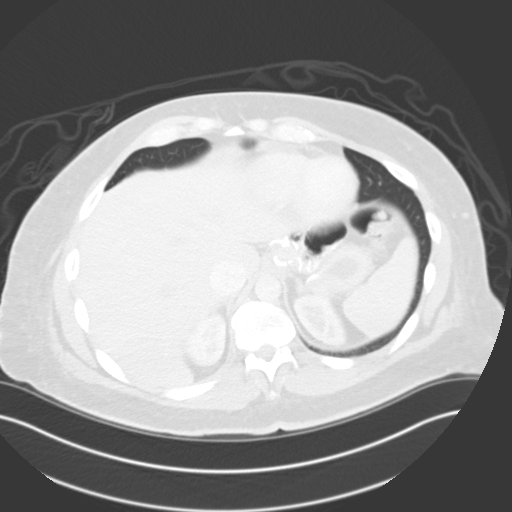
[im 87/100  lung]
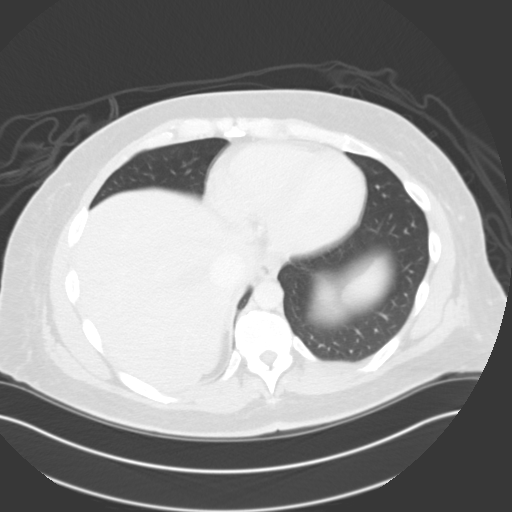
[im 91/100  soft-tissue]
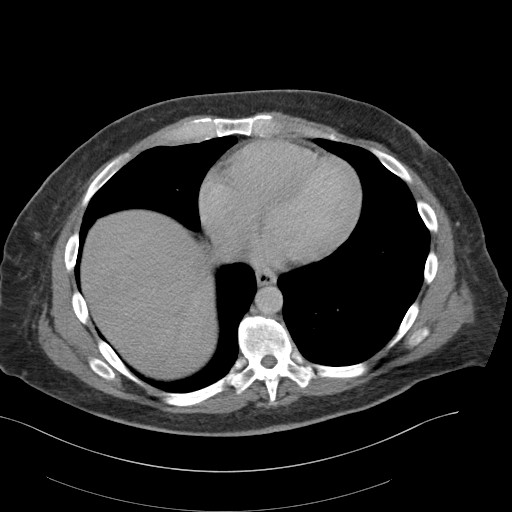
[im 91/100  lung]
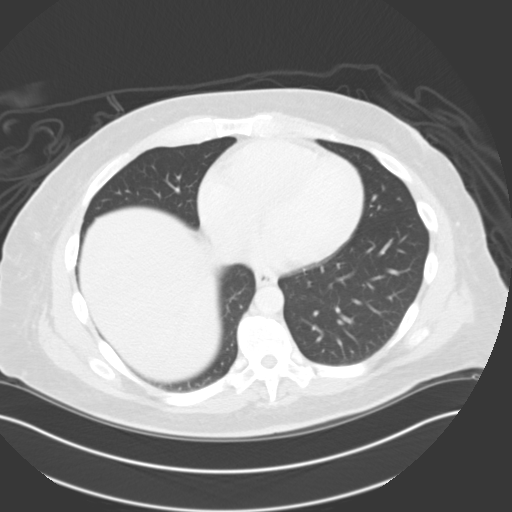
[im 91/100  bone]
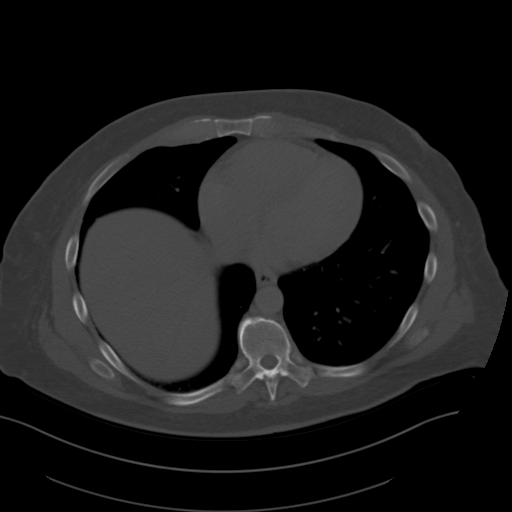
[im 95/100  lung]
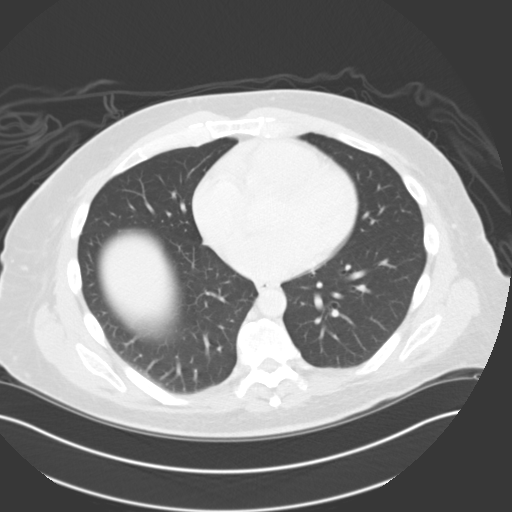

[Series 5: coronals · coronal · 0.88mm/px · 3 of 181 slices shown]
[im 61/181  soft-tissue]
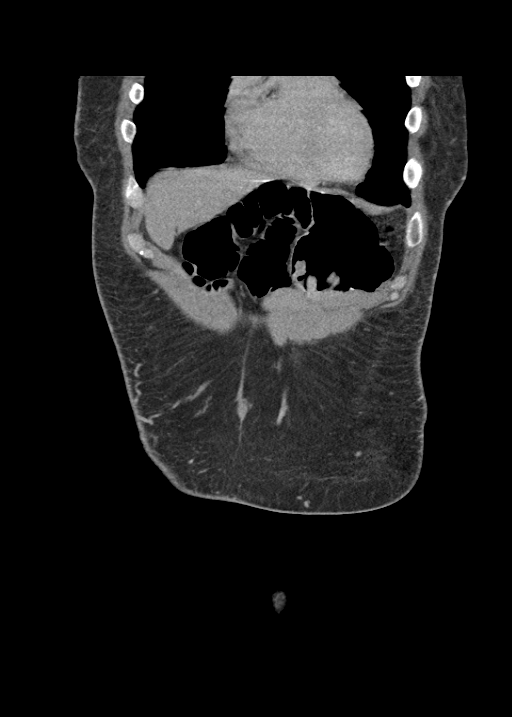
[im 91/181  soft-tissue]
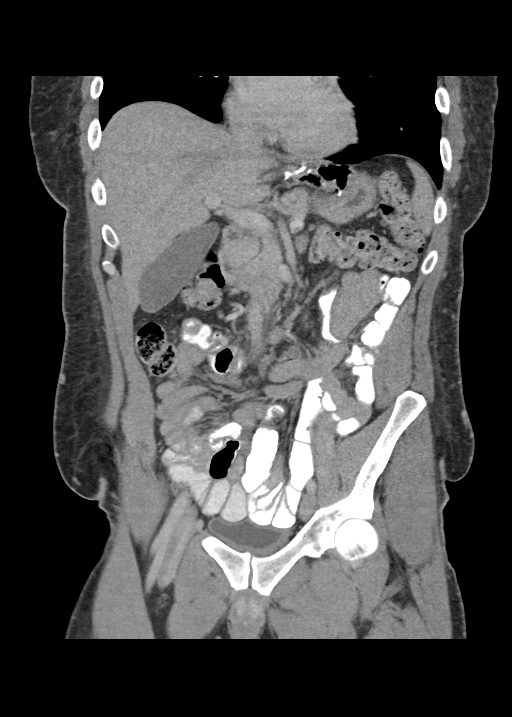
[im 121/181  soft-tissue]
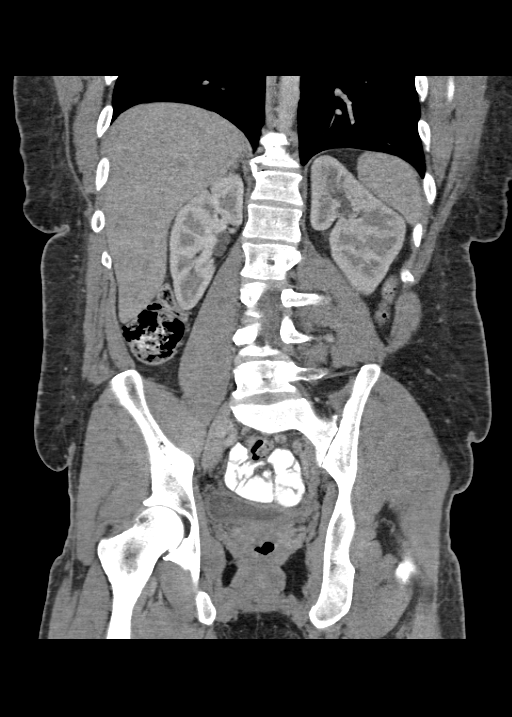

[14 of 46 positions shown; findings below may reference images not displayed]

FINDINGS: Visualization of the lower thorax demonstrates no consolidative or
nodular pulmonary opacities. Normal heart size.

Liver, gallbladder, spleen, pancreas and bilateral adrenal glands
are unremarkable. Kidneys enhance symmetrically with contrast. No
hydronephrosis. Normal caliber abdominal aorta. No retroperitoneal
lymphadenopathy. Urinary bladder is unremarkable. Prostate
unremarkable.

Sequelae of prior gastric bypass surgery. No abnormal bowel wall
thickening or evidence for bowel obstruction. Oral contrast material
demonstrated throughout the small bowel to the level of the cecum.
The appendix is normal. No free fluid or free intraperitoneal air.
There is mild swelling of small bowel mesentery within the central
abdomen without evidence for associated obstruction. Very minimal
mesenteric edema is likely postoperative in etiology.

Lower lumbar spine degenerative change. No aggressive appearing
osseous lesions.
IMPRESSION: No definite cause for acute abdominal pain identified.

Normal appendix.

Mild swirling of the mesentery within the central abdomen without
evidence for obstruction is favored to be postoperative in etiology.

## 2016-05-22 MED FILL — BUPROPION HCL XL 150 MG TAB: 150 | 30 days supply | Qty: 30 | Fill #0 | Status: TO

## 2016-05-29 DIAGNOSIS — I8393 Asymptomatic varicose veins of bilateral lower extremities: Secondary | ICD-10-CM | POA: Diagnosis not present

## 2016-06-10 MED FILL — metroNIDAZOLE 500 MG TABS: 500 | 10 days supply | Qty: 30 | Fill #0

## 2016-06-10 MED FILL — SULFAMETHOXAZOLE/TMP DS TAB: 800-160 | 7 days supply | Qty: 14 | Fill #0

## 2016-06-12 DIAGNOSIS — I8393 Asymptomatic varicose veins of bilateral lower extremities: Secondary | ICD-10-CM | POA: Diagnosis not present

## 2016-06-17 DIAGNOSIS — M545 Low back pain: Secondary | ICD-10-CM | POA: Diagnosis not present

## 2016-06-17 DIAGNOSIS — Z9884 Bariatric surgery status: Secondary | ICD-10-CM | POA: Diagnosis not present

## 2016-06-17 DIAGNOSIS — R1030 Lower abdominal pain, unspecified: Secondary | ICD-10-CM | POA: Diagnosis not present

## 2016-07-01 ENCOUNTER — Encounter: Payer: Self-pay | Admitting: Internal Medicine

## 2016-07-01 ENCOUNTER — Ambulatory Visit (INDEPENDENT_AMBULATORY_CARE_PROVIDER_SITE_OTHER): Payer: 59 | Admitting: Internal Medicine

## 2016-07-01 VITALS — BP 128/86 | HR 99 | Temp 98.1°F | Resp 16 | Ht 76.0 in | Wt 259.8 lb

## 2016-07-01 DIAGNOSIS — F411 Generalized anxiety disorder: Secondary | ICD-10-CM | POA: Diagnosis not present

## 2016-07-01 DIAGNOSIS — Z980 Intestinal bypass and anastomosis status: Secondary | ICD-10-CM | POA: Diagnosis not present

## 2016-07-01 DIAGNOSIS — R195 Other fecal abnormalities: Secondary | ICD-10-CM | POA: Diagnosis not present

## 2016-07-01 DIAGNOSIS — Z8719 Personal history of other diseases of the digestive system: Secondary | ICD-10-CM | POA: Diagnosis not present

## 2016-07-01 DIAGNOSIS — R141 Gas pain: Secondary | ICD-10-CM | POA: Diagnosis not present

## 2016-07-01 DIAGNOSIS — K649 Unspecified hemorrhoids: Secondary | ICD-10-CM

## 2016-07-01 DIAGNOSIS — D649 Anemia, unspecified: Secondary | ICD-10-CM | POA: Diagnosis not present

## 2016-07-01 DIAGNOSIS — Z9889 Other specified postprocedural states: Secondary | ICD-10-CM | POA: Diagnosis not present

## 2016-07-01 DIAGNOSIS — Z79899 Other long term (current) drug therapy: Secondary | ICD-10-CM | POA: Diagnosis not present

## 2016-07-01 DIAGNOSIS — Z9884 Bariatric surgery status: Secondary | ICD-10-CM | POA: Diagnosis not present

## 2016-07-01 DIAGNOSIS — Z7952 Long term (current) use of systemic steroids: Secondary | ICD-10-CM | POA: Diagnosis not present

## 2016-07-01 DIAGNOSIS — K5900 Constipation, unspecified: Secondary | ICD-10-CM | POA: Diagnosis not present

## 2016-07-01 LAB — CBC WITH DIFFERENTIAL/PLATELET
BASOS ABS: 0 10*3/uL (ref 0.0–0.1)
Basophils Relative: 0.6 % (ref 0.0–3.0)
EOS PCT: 1.7 % (ref 0.0–5.0)
Eosinophils Absolute: 0.1 10*3/uL (ref 0.0–0.7)
HCT: 34.5 % — ABNORMAL LOW (ref 39.0–52.0)
Hemoglobin: 11.3 g/dL — ABNORMAL LOW (ref 13.0–17.0)
LYMPHS PCT: 30.8 % (ref 12.0–46.0)
Lymphs Abs: 2.1 10*3/uL (ref 0.7–4.0)
MCHC: 32.7 g/dL (ref 30.0–36.0)
MCV: 93.3 fl (ref 78.0–100.0)
MONOS PCT: 7.7 % (ref 3.0–12.0)
Monocytes Absolute: 0.5 10*3/uL (ref 0.1–1.0)
NEUTROS PCT: 59.2 % (ref 43.0–77.0)
Neutro Abs: 4 10*3/uL (ref 1.4–7.7)
Platelets: 233 10*3/uL (ref 150.0–400.0)
RBC: 3.7 Mil/uL — AB (ref 4.22–5.81)
RDW: 15 % (ref 11.5–15.5)
WBC: 6.8 10*3/uL (ref 4.0–10.5)

## 2016-07-01 LAB — IBC PANEL
Iron: 51 ug/dL (ref 42–165)
SATURATION RATIOS: 11.8 % — AB (ref 20.0–50.0)
Transferrin: 309 mg/dL (ref 212.0–360.0)

## 2016-07-01 LAB — FERRITIN: FERRITIN: 16.8 ng/mL — AB (ref 22.0–322.0)

## 2016-07-01 MED ORDER — HYDROCORTISONE ACETATE 25 MG RE SUPP: 25.0000 mg | Freq: Two times a day (BID) | RECTAL | 5 refills | Status: DC

## 2016-07-01 MED FILL — ANUCORT-HC 25 MG SUPP: 25 | 6 days supply | Qty: 12 | Fill #0

## 2016-07-01 NOTE — Patient Instructions (Signed)
Your external hemorrhoid is very small and not enflamed currently  I refilled the Anusol HC suppositories,  But to prevent irritation from soft/hard stools,  Try using the medicated flushable wipes that preparation [employer] company makes  You can also use Nupercainal cream if needed for pain   You can also soak your bottom in epsom salt baths for 15 minutes,  This will shrink enflamed hemorrhoids  I am checking iron stores today since you are mildly anemic

## 2016-07-01 NOTE — Progress Notes (Signed)
Subjective:  Patient ID: Chase Carey, male    DOB: 04-27-1976  Age: 41 y.o. MRN: [id number]  CC: The primary encounter diagnosis was Anemia, unspecified type. Diagnoses of Hemorrhoids, unspecified hemorrhoid type and Generalized anxiety disorder were also pertinent to this visit.  HPI Chase Booksugene R Saxton presents for follow up on anxiety and hemorrhoids..  Since his last visit he was [facility] secondary to SBO.  He underwent laparoscopic reduction of internal hernia, closure of mesenteric defect. LOA and closure of enterotomy on Oct 13. He lost approximately 20 lbs throughout his ordeal ,  But has gained some weight back.  STILL CONTSTIPATED USING MIRALAX, HAD AN  X RAY HN   SURGEON REC MAG CITRATE.          GAD:   LEXAPRO was added to wellbutrin  At his last visit.  He reports improved mood, less irritability and  No side effects. HAS LOST LL LBS SINCE LAST VISIT      Outpatient Medications Prior to Visit  Medication Sig Dispense Refill  . buPROPion (WELLBUTRIN XL) 150 MG 24 hr tablet TAKE 1 TABLET BY MOUTH DAILY. 30 tablet 5  . calcium carbonate (OS-CAL) 600 MG TABS Take 600 mg by mouth 2 (two) times daily with a meal.    . cholecalciferol (VITAMIN D) 1000 UNITS tablet Take 3,000 Units by mouth daily.     . clobetasol (OLUX) 0.05 % topical foam Apply topically 2 (two) times daily.    Marland Kitchen. EPINEPHrine (EPIPEN 2-PAK) 0.3 mg/0.3 mL IJ SOAJ injection Inject 0.3 mLs (0.3 mg total) into the muscle once. 1 Device 1  . escitalopram (LEXAPRO) 10 MG tablet Take 1 tablet (10 mg total) by mouth daily. 30 tablet 5  . Multiple Vitamin (MULTIVITAMINS PO) Take by mouth daily.    . Multiple Vitamins-Minerals (ZINC PO) Take 1 tablet by mouth daily.    Marland Kitchen. nystatin (MYCOSTATIN) powder Apply topically 2 (two) times daily. 15 g 3  . omeprazole (PRILOSEC) 20 MG capsule Take 1 capsule (20 mg total) by mouth daily. 30 capsule 3  . Probiotic Product (PROBIOTIC DAILY PO) Take by mouth  daily.    . vitamin B-12 (CYANOCOBALAMIN) 100 MCG tablet Take 100 mcg by mouth daily.     No facility-administered medications prior to visit.     Review of Systems;  Patient denies headache, fevers, malaise, unintentional weight loss, skin rash, eye pain, sinus congestion and sinus pain, sore throat, dysphagia,  hemoptysis , cough, dyspnea, wheezing, chest pain, palpitations, orthopnea, edema, abdominal pain, nausea, melena, diarrhea, constipation, flank pain, dysuria, hematuria, urinary  Frequency, nocturia, numbness, tingling, seizures,  Focal weakness, Loss of consciousness,  Tremor, insomnia, depression, anxiety, and suicidal ideation.      Objective:  BP 128/86   Pulse 99   Temp 98.1 F (36.7 C) (Oral)   Resp 16   Ht 6\' 4"  (1.93 m)   Wt 259 lb 12.8 oz (117.8 kg)   SpO2 97%   BMI 31.62 kg/m   BP Readings from Last 3 Encounters:  07/01/16 128/86  03/31/16 120/78  01/28/16 134/83    Wt Readings from Last 3 Encounters:  07/01/16 259 lb 12.8 oz (117.8 kg)  03/31/16 270 lb 8 oz (122.7 kg)  01/28/16 261 lb 4 oz (118.5 kg)    General appearance: alert, cooperative and appears stated age Ears: normal TM's and external ear canals both ears Throat: lips, mucosa, and tongue normal; teeth and gums normal Neck: no  adenopathy, no carotid bruit, supple, symmetrical, trachea midline and thyroid not enlarged, symmetric, no tenderness/mass/nodules Back: symmetric, no curvature. ROM normal. No CVA tenderness. Lungs: clear to auscultation bilaterally Heart: regular rate and rhythm, S1, S2 normal, no murmur, click, rub or gallop Abdomen: well healed scars. soft, non-tender; bowel sounds normal; no masses,  no organomegaly Pulses: 2+ and symmetric Skin: Skin color, texture, turgor normal. No rashes or lesions Lymph nodes: Cervical, supraclavicular, and axillary nodes normal.  Lab Results  Component Value Date   HGBA1C 5.1 03/31/2016   HGBA1C 4.9 09/24/2012    Lab Results    Component Value Date   CREATININE 0.85 03/31/2016   CREATININE 0.74 12/12/2014   CREATININE 0.9 02/22/2014    Lab Results  Component Value Date   WBC 6.8 07/01/2016   HGB 11.3 (L) 07/01/2016   HCT 34.5 (L) 07/01/2016   PLT 233.0 07/01/2016   GLUCOSE 96 03/31/2016   CHOL 205 (H) 03/31/2016   TRIG 90.0 03/31/2016   HDL 74.00 03/31/2016   LDLCALC 113 (H) 03/31/2016   ALT 15 03/31/2016   AST 31 03/31/2016   NA 138 03/31/2016   K 3.8 03/31/2016   CL 103 03/31/2016   CREATININE 0.85 03/31/2016   BUN 14 03/31/2016   CO2 29 03/31/2016   TSH 1.510 12/12/2014   PSA 0.54 03/31/2016   HGBA1C 5.1 03/31/2016    US Venous Img Lower Unilateral Left  Result Date: 12/13/2015 CLINICAL DATA:  Left lower extremity pain. EXAM: Left LOWER EXTREMITY VENOUS DOPPLER ULTRASOUND TECHNIQUE: Gray-scale sonography with graded compression, as well as color Doppler and duplex ultrasound were performed to evaluate the lower extremity deep venous systems from the level of the common femoral vein and including the common femoral, femoral, profunda femoral, popliteal and calf veins including the posterior tibial, peroneal and gastrocnemius veins when visible. The superficial great saphenous vein was also interrogated. Spectral Doppler was utilized to evaluate flow at rest and with distal augmentation maneuvers in the common femoral, femoral and popliteal veins. COMPARISON:  None. FINDINGS: Contralateral Common Femoral Vein: Respiratory phasicity is normal and symmetric with the symptomatic side. No evidence of thrombus. Normal compressibility. Common Femoral Vein: No evidence of thrombus. Normal compressibility, respiratory phasicity and response to augmentation. Saphenofemoral Junction: No evidence of thrombus. Normal compressibility and flow on color Doppler imaging. Profunda Femoral Vein: No evidence of thrombus. Normal compressibility and flow on color Doppler imaging. Femoral Vein: No evidence of thrombus. Normal  compressibility, respiratory phasicity and response to augmentation. Popliteal Vein: No evidence of thrombus. Normal compressibility, respiratory phasicity and response to augmentation. Calf Veins: No evidence of thrombus. Normal compressibility and flow on color Doppler imaging. Superficial Great Saphenous Vein: No evidence of thrombus. Normal compressibility and flow on color Doppler imaging. Venous Reflux:  None. Other Findings: Thrombosed superficial varicosities are noted in area of pain in left lateral upper calf. IMPRESSION: No evidence of deep venous thrombosis seen in left lower extremity. Thrombosed superficial varicosities are noted in area of pain in left lateral upper calf. Electronically Signed   By: Lupita Raider, M.D.   On: 12/13/2015 15:21    Assessment & Plan:   Problem List Items Addressed This Visit    Anemia - Primary   Generalized anxiety disorder    improved mood and temperament since lexapro was started at 10 mg daily .  No changes today        Other Visit Diagnoses    Hemorrhoids, unspecified hemorrhoid type  I am having Mr. Decicco start on hydrocortisone. I am also having him maintain his vitamin B-12, calcium carbonate, Multiple Vitamin (MULTIVITAMINS PO), clobetasol, cholecalciferol, Multiple Vitamins-Minerals (ZINC PO), Probiotic Product (PROBIOTIC DAILY PO), nystatin, omeprazole, EPINEPHrine, escitalopram, and buPROPion.  Meds ordered this encounter  Medications  . hydrocortisone (ANUSOL-HC) 25 MG suppository    Sig: Place 1 suppository (25 mg total) rectally 2 (two) times daily.    Dispense:  12 suppository    Refill:  5    There are no discontinued medications.  Follow-up: Return in about 6 months (around 12/29/2016).   Sherlene Shams, MD

## 2016-07-02 DIAGNOSIS — I8393 Asymptomatic varicose veins of bilateral lower extremities: Secondary | ICD-10-CM | POA: Diagnosis not present

## 2016-07-02 NOTE — Assessment & Plan Note (Addendum)
improved mood and temperament since lexapro was started at 10 mg daily .  No changes today

## 2016-07-03 ENCOUNTER — Encounter: Payer: Self-pay | Admitting: Internal Medicine

## 2016-07-03 LAB — METHYLMALONIC ACID, SERUM: METHYLMALONIC ACID, QUANT: 179 nmol/L (ref 87–318)

## 2016-07-15 DIAGNOSIS — I8393 Asymptomatic varicose veins of bilateral lower extremities: Secondary | ICD-10-CM | POA: Diagnosis not present

## 2016-07-23 DIAGNOSIS — I8393 Asymptomatic varicose veins of bilateral lower extremities: Secondary | ICD-10-CM | POA: Diagnosis not present

## 2016-07-28 ENCOUNTER — Ambulatory Visit (INDEPENDENT_AMBULATORY_CARE_PROVIDER_SITE_OTHER): Payer: 59 | Admitting: Family Medicine

## 2016-07-28 ENCOUNTER — Encounter: Payer: Self-pay | Admitting: Family Medicine

## 2016-07-28 VITALS — BP 107/70 | HR 108 | Temp 98.3°F | Wt 264.6 lb

## 2016-07-28 DIAGNOSIS — R8290 Unspecified abnormal findings in urine: Secondary | ICD-10-CM

## 2016-07-28 DIAGNOSIS — R399 Unspecified symptoms and signs involving the genitourinary system: Secondary | ICD-10-CM | POA: Insufficient documentation

## 2016-07-28 LAB — POCT URINALYSIS DIPSTICK
BILIRUBIN UA: NEGATIVE
GLUCOSE UA: NEGATIVE
KETONES UA: NEGATIVE
NITRITE UA: NEGATIVE
PH UA: 6.5
Protein, UA: NEGATIVE
Spec Grav, UA: 1.01
Urobilinogen, UA: 0.2

## 2016-07-28 MED ORDER — CIPROFLOXACIN HCL 500 MG PO TABS: 500.0000 mg | ORAL_TABLET | Freq: Two times a day (BID) | ORAL | 0 refills | Status: DC

## 2016-07-28 NOTE — Patient Instructions (Addendum)
Take the antibiotic as prescribed.  We will call with the results.  Take care  Dr. Adriana Simasook

## 2016-07-28 NOTE — Progress Notes (Signed)
Pre visit review using our clinic review tool, if applicable. No additional management support is needed unless otherwise documented below in the visit note. 

## 2016-07-28 NOTE — Assessment & Plan Note (Signed)
New problem. With reports of urgency and incontinence. Urinalysis with trace blood and small leukocytes. Concern for UTI. Possible prostatitis. Does not appear to have a kidney stone or pyelonephritis at this time. Treating empirically with Cipro.  Obtaining culture.

## 2016-07-28 NOTE — Progress Notes (Signed)
Subjective:  Patient ID: Chase Carey, male    DOB: 16-Aug-1975  Age: 41 y.o. MRN: [id number]  CC: Foul smelling urine  HPI:  41 year old male with history of BPH presents with the above complaint.  Patient states that he did not feel well on Friday. He had a headache at that time. On Saturday he felt feverish. He did not take his temperature. He reports that he had foul-smelling urine. He denies any dysuria, urinary frequency/urgency. However, on Sunday he had an episode of urgency and incontinence. No flank pain. No abdominal pain. No nausea or vomiting. No known exacerbating or relieving factors. No other associated symptoms. No recent hospitalization. No recent catheter placement or urological procedures. No other complaints or concerns at this time.   Social Hx   Social History   Social History  . Marital status: Married    Spouse name: N/A  . Number of children: 2  . Years of education: college   Occupational History  . [employer]    Social History Main Topics  . Smoking status: Never Smoker  . Smokeless tobacco: Never Used  . Alcohol use No     Comment: occasional  1 - 2 times per year wine  . Drug use: No  . Sexual activity: Not Asked   Other Topics Concern  . None   Social History Narrative   Unknown family history. Adopted. Married x 10.5 years, happily married. Caffeine use: Coffee, 2 servings per day. Guns in the home stored in locked cabinet, loaded. No formal exercise program.    Review of Systems  Constitutional:       Subjective fever.  Gastrointestinal: Negative.   Genitourinary: Positive for urgency.       Foul smelling urine.   Objective:  BP 107/70   Pulse (!) 108   Temp 98.3 F (36.8 C) (Oral)   Wt 264 lb 9.6 oz (120 kg)   SpO2 99%   BMI 32.21 kg/m   BP/Weight 07/28/2016 07/01/2016 03/31/2016  Systolic BP 107 128 120  Diastolic BP 70 86 78  Wt. (Lbs) 264.6 259.8 270.5  BMI 32.21 31.62 33.36   Physical Exam  Constitutional: He  is oriented to person, place, and time. He appears well-developed. No distress.  Cardiovascular: Regular rhythm.   Tachycardic.  Pulmonary/Chest: Effort normal and breath sounds normal.  Abdominal: Soft. He exhibits no distension. There is no tenderness. There is no rebound and no guarding.  No CVA tenderness.  Neurological: He is alert and oriented to person, place, and time.  Psychiatric: He has a normal mood and affect.  Vitals reviewed.  Lab Results  Component Value Date   WBC 6.8 07/01/2016   HGB 11.3 (L) 07/01/2016   HCT 34.5 (L) 07/01/2016   PLT 233.0 07/01/2016   GLUCOSE 96 03/31/2016   CHOL 205 (H) 03/31/2016   TRIG 90.0 03/31/2016   HDL 74.00 03/31/2016   LDLCALC 113 (H) 03/31/2016   ALT 15 03/31/2016   AST 31 03/31/2016   NA 138 03/31/2016   K 3.8 03/31/2016   CL 103 03/31/2016   CREATININE 0.85 03/31/2016   BUN 14 03/31/2016   CO2 29 03/31/2016   TSH 1.510 12/12/2014   PSA 0.54 03/31/2016   HGBA1C 5.1 03/31/2016   Results for orders placed or performed in visit on 07/28/16 (from the past 24 hour(s))  POCT Urinalysis Dipstick     Status: Abnormal   Collection Time: 07/28/16  3:04 PM  Result Value Ref Range  Color, UA yellow    Clarity, UA clear    Glucose, UA negative    Bilirubin, UA negative    Ketones, UA negative    Spec Grav, UA 1.010    Blood, UA trace-intact    pH, UA 6.5    Protein, UA negative    Urobilinogen, UA 0.2    Nitrite, UA negative    Leukocytes, UA small (1+) (A) Negative    Assessment & Plan:   Problem List Items Addressed This Visit    Foul smelling urine - Primary    New problem. With reports of urgency and incontinence. Urinalysis with trace blood and small leukocytes. Concern for UTI. Possible prostatitis. Does not appear to have a kidney stone or pyelonephritis at this time. Treating empirically with Cipro.  Obtaining culture.      Relevant Orders   POCT Urinalysis Dipstick (Completed)   Urine Culture     Meds  ordered this encounter  Medications  . ciprofloxacin (CIPRO) 500 MG tablet    Sig: Take 1 tablet (500 mg total) by mouth 2 (two) times daily.    Dispense:  28 tablet    Refill:  0    Follow-up: PRN  Everlene Other DO [facility]

## 2016-07-29 LAB — URINE CULTURE

## 2016-07-30 DIAGNOSIS — I83893 Varicose veins of bilateral lower extremities with other complications: Secondary | ICD-10-CM | POA: Diagnosis not present

## 2016-08-07 DIAGNOSIS — I83893 Varicose veins of bilateral lower extremities with other complications: Secondary | ICD-10-CM | POA: Diagnosis not present

## 2016-08-27 DIAGNOSIS — I83893 Varicose veins of bilateral lower extremities with other complications: Secondary | ICD-10-CM | POA: Diagnosis not present

## 2016-09-04 DIAGNOSIS — I83893 Varicose veins of bilateral lower extremities with other complications: Secondary | ICD-10-CM | POA: Diagnosis not present

## 2016-09-09 ENCOUNTER — Encounter: Payer: Self-pay | Admitting: Internal Medicine

## 2016-09-17 DIAGNOSIS — I83893 Varicose veins of bilateral lower extremities with other complications: Secondary | ICD-10-CM | POA: Diagnosis not present

## 2016-09-18 DIAGNOSIS — I83893 Varicose veins of bilateral lower extremities with other complications: Secondary | ICD-10-CM | POA: Diagnosis not present

## 2016-10-24 ENCOUNTER — Other Ambulatory Visit: Payer: Self-pay | Admitting: Internal Medicine

## 2016-10-24 MED FILL — BUPROPION HCL XL 150 MG TAB: 150 | 30 days supply | Qty: 30 | Fill #0

## 2016-10-24 MED FILL — TAMSULOSIN HCL 0.4 MG CAP: 0.4 | 30 days supply | Qty: 60 | Fill #0

## 2016-10-24 MED FILL — ESCITALOPRAM 10 MG TABLET: 10 | 30 days supply | Qty: 30 | Fill #0

## 2016-11-05 ENCOUNTER — Ambulatory Visit (INDEPENDENT_AMBULATORY_CARE_PROVIDER_SITE_OTHER): Payer: 59 | Admitting: Podiatry

## 2016-11-05 ENCOUNTER — Encounter: Payer: Self-pay | Admitting: Podiatry

## 2016-11-05 DIAGNOSIS — M76829 Posterior tibial tendinitis, unspecified leg: Secondary | ICD-10-CM

## 2016-11-05 NOTE — Progress Notes (Signed)
He presents today after having seen Dr. Loreta AveWagner several times in the past. He states that he has orthotics that have been made several years ago because of his flat feet. He states that my feet are getting worse and the orthotics seem to be working as well.  Objective: Vital signs are stable he is alert and oriented 3 have reviewed his past medical history medications allergies surgeries and social history. Pulses are strongly palpable. Neurologic sensorium is intact. Posterior tibial tendon dysfunction is obvious with severe eversion of the calcaneus plantar flexion of the midfoot and forefoot.  Assessment: Severe posterior tibial tendon dysfunction with severe flexible flatfoot deformity.  Plan: At this point I feel that he needs a new orthotic with a lateral flange and medial flange and a deep heel cup. I'm going to ask that he follow up with Raiford Nobleick at our [place] office next Tuesday for this measurement.  Note: Replacement sure the discharge is dropped on that day.

## 2016-11-11 ENCOUNTER — Ambulatory Visit (INDEPENDENT_AMBULATORY_CARE_PROVIDER_SITE_OTHER): Payer: 59 | Admitting: Podiatry

## 2016-11-11 DIAGNOSIS — M76821 Posterior tibial tendinitis, right leg: Secondary | ICD-10-CM | POA: Diagnosis not present

## 2016-11-11 DIAGNOSIS — M76822 Posterior tibial tendinitis, left leg: Secondary | ICD-10-CM

## 2016-11-11 NOTE — Progress Notes (Signed)
Patient came in today for casting for CMFO...patient presents with acquired adult flatfoot (PTTD), as well as hammertoes.    The F/O previous rx is made out of subortholene/cork and compresses upon wt bearing.  The goal is provide rear foot stability, longitudinal arch support, add supinatory torque on RF, and pronatory torque of FF...  Plan on EVERFEET CMFO semi-rigid device (poly pro), deep heel (14mm), M/L flange, 4* RF varus, and 4* FF valgus wedge...ppt padding and 3mm spenco top cover..   Code to be charged L3000 x 2 RTLT

## 2016-11-17 ENCOUNTER — Encounter: Payer: Self-pay | Admitting: Internal Medicine

## 2016-11-18 MED FILL — ESCITALOPRAM 10 MG TABLET: 10 | 30 days supply | Qty: 30 | Fill #1

## 2016-11-19 ENCOUNTER — Other Ambulatory Visit: Payer: Self-pay

## 2016-11-19 MED ORDER — BUPROPION HCL ER (XL) 150 MG PO TB24: 150.0000 mg | ORAL_TABLET | Freq: Every day | ORAL | 5 refills | Status: DC

## 2016-11-19 MED FILL — BUPROPION HCL XL 150 MG TAB: 150 | 30 days supply | Qty: 30 | Fill #0

## 2016-12-10 ENCOUNTER — Other Ambulatory Visit: Payer: 59 | Admitting: Orthotics

## 2016-12-18 MED FILL — TAMSULOSIN HCL 0.4 MG CAP: 0.4 | 30 days supply | Qty: 60 | Fill #1

## 2016-12-18 MED FILL — ESCITALOPRAM 10 MG TABLET: 10 | 30 days supply | Qty: 30 | Fill #2 | Status: TO

## 2016-12-18 MED FILL — BUPROPION HCL XL 150 MG TAB: 150 | 30 days supply | Qty: 30 | Fill #1

## 2016-12-23 MED FILL — ANUCORT-HC 25 MG SUPP: 25 | 6 days supply | Qty: 12 | Fill #1

## 2016-12-25 ENCOUNTER — Ambulatory Visit: Payer: 59 | Admitting: Orthotics

## 2016-12-26 DIAGNOSIS — I83893 Varicose veins of bilateral lower extremities with other complications: Secondary | ICD-10-CM | POA: Diagnosis not present

## 2016-12-29 ENCOUNTER — Encounter: Payer: Self-pay | Admitting: Internal Medicine

## 2016-12-29 ENCOUNTER — Ambulatory Visit (INDEPENDENT_AMBULATORY_CARE_PROVIDER_SITE_OTHER): Payer: 59 | Admitting: Internal Medicine

## 2016-12-29 VITALS — BP 118/70 | HR 88 | Temp 97.9°F | Resp 16 | Ht 76.0 in | Wt 275.8 lb

## 2016-12-29 DIAGNOSIS — E6609 Other obesity due to excess calories: Secondary | ICD-10-CM | POA: Diagnosis not present

## 2016-12-29 DIAGNOSIS — R41840 Attention and concentration deficit: Secondary | ICD-10-CM | POA: Diagnosis not present

## 2016-12-29 DIAGNOSIS — Z6833 Body mass index (BMI) 33.0-33.9, adult: Secondary | ICD-10-CM

## 2016-12-29 DIAGNOSIS — R252 Cramp and spasm: Secondary | ICD-10-CM

## 2016-12-29 DIAGNOSIS — D508 Other iron deficiency anemias: Secondary | ICD-10-CM | POA: Diagnosis not present

## 2016-12-29 LAB — CBC WITH DIFFERENTIAL/PLATELET
Basophils Absolute: 0 10*3/uL (ref 0.0–0.1)
Basophils Relative: 0.7 % (ref 0.0–3.0)
EOS PCT: 1.7 % (ref 0.0–5.0)
Eosinophils Absolute: 0.1 10*3/uL (ref 0.0–0.7)
HEMATOCRIT: 37.2 % — AB (ref 39.0–52.0)
HEMOGLOBIN: 12.1 g/dL — AB (ref 13.0–17.0)
LYMPHS PCT: 34.5 % (ref 12.0–46.0)
Lymphs Abs: 2.1 10*3/uL (ref 0.7–4.0)
MCHC: 32.6 g/dL (ref 30.0–36.0)
MCV: 95.3 fl (ref 78.0–100.0)
MONOS PCT: 9.1 % (ref 3.0–12.0)
Monocytes Absolute: 0.6 10*3/uL (ref 0.1–1.0)
Neutro Abs: 3.3 10*3/uL (ref 1.4–7.7)
Neutrophils Relative %: 54 % (ref 43.0–77.0)
Platelets: 226 10*3/uL (ref 150.0–400.0)
RBC: 3.9 Mil/uL — AB (ref 4.22–5.81)
RDW: 14.4 % (ref 11.5–15.5)
WBC: 6.1 10*3/uL (ref 4.0–10.5)

## 2016-12-29 LAB — COMPREHENSIVE METABOLIC PANEL
ALBUMIN: 4 g/dL (ref 3.5–5.2)
ALT: 21 U/L (ref 0–53)
AST: 48 U/L — AB (ref 0–37)
Alkaline Phosphatase: 58 U/L (ref 39–117)
BUN: 18 mg/dL (ref 6–23)
CHLORIDE: 101 meq/L (ref 96–112)
CO2: 27 mEq/L (ref 19–32)
CREATININE: 0.92 mg/dL (ref 0.40–1.50)
Calcium: 9.2 mg/dL (ref 8.4–10.5)
GFR: 115.53 mL/min (ref >60.00)
Glucose, Bld: 106 mg/dL — ABNORMAL HIGH (ref 70–99)
Potassium: 3.9 mEq/L (ref 3.5–5.1)
SODIUM: 134 meq/L — AB (ref 135–145)
Total Bilirubin: 0.5 mg/dL (ref 0.2–1.2)
Total Protein: 7.2 g/dL (ref 6.0–8.3)

## 2016-12-29 LAB — TSH: TSH: 2.88 u[IU]/mL (ref 0.35–4.50)

## 2016-12-29 MED ORDER — BUPROPION HCL ER (XL) 300 MG PO TB24: 300.0000 mg | ORAL_TABLET | Freq: Every day | ORAL | 2 refills | Status: DC

## 2016-12-29 NOTE — Patient Instructions (Addendum)
   Try taking miralax daily 30 minutes before lunch.  This will curb your appetite and keep you more regular.  If no improvement in weight in one month,  We can start Saxenda . The daily injectable medication we discussed today.      I have increased your wellbutrin dose to 300 mg to improve your motivation, energy and concentration.  Let me know if you do not tolerate it.    Muscle cramps may be due to loss of sodium when you work out.   G2 or Powerade ZERO FOR electrolyte replacement  drinks

## 2016-12-29 NOTE — Progress Notes (Signed)
Subjective:  Patient ID: Chase Carey, male    DOB: 06/20/1976  Age: 41 y.o. MRN: [id number]  CC: The primary encounter diagnosis was Muscle cramping. Diagnoses of Iron deficiency anemia secondary to inadequate dietary iron intake, Concentration deficit, and Class 1 obesity due to excess calories without serious comorbidity with body mass index (BMI) of 33.0 to 33.9 in adult were also pertinent to this visit.  HPI Chase Carey presents for 6 month follow up on multiple issues including major depressive disorder and return of obesity following bariatric surgery. Body mass index is 33.57 kg/m.   He has had continued weight gain with another  16 lbs since last visit in January.  His weight gain is  aggravated by his concurrent  Depression and lack of motivation, along with his habit of "stress eating." He states that  for the past 1.5 has not exercised.  foot problems and recent podiatry referral resulted in use of orthotics for pes planus has helped but not relieved his foot pain.  He did resume exercise a month ago.  Working out 3 times weekly  But often only twice.  His weight nadir after his surgery in 2015 was 231 LBS, a loss of 150 lbs .       Depression: taking wellbutrin XL 150 mg  Still feels foggy at times.  .    History of Iron def anemia due to gastric bypass . Has been taking a daily irone supplement since last visit.   Diet reviewed:  Eats 5 ot 6 mreals daily ,  Protein shake for bfast  Small midmorning snack,  Leftover dinner for lunch, snacks are greek yogurt or banana and peanut butter.  Brandy Hale[asta once a week.  No fast food. Marland Kitchen.   CRAMPING IN LEGS AND STOMACH  Taking iron supplement once daily   Weight lifting    Outpatient Medications Prior to Visit  Medication Sig Dispense Refill  . calcium carbonate (OS-CAL) 600 MG TABS Take 600 mg by mouth 2 (two) times daily with a meal.    . cholecalciferol (VITAMIN D) 1000 UNITS tablet Take 3,000 Units by mouth daily.     .  clobetasol (OLUX) 0.05 % topical foam Apply topically 2 (two) times daily.    Marland Kitchen. EPINEPHrine (EPIPEN 2-PAK) 0.3 mg/0.3 mL IJ SOAJ injection Inject 0.3 mLs (0.3 mg total) into the muscle once. 1 Device 1  . escitalopram (LEXAPRO) 10 MG tablet TAKE 1 TABLET BY MOUTH DAILY 30 tablet 3  . hydrocortisone (ANUSOL-HC) 25 MG suppository Place 1 suppository (25 mg total) rectally 2 (two) times daily. 12 suppository 5  . Multiple Vitamin (MULTIVITAMINS PO) Take by mouth daily.    . Multiple Vitamins-Minerals (ZINC PO) Take 1 tablet by mouth daily.    . Probiotic Product (PROBIOTIC DAILY PO) Take by mouth daily.    . vitamin B-12 (CYANOCOBALAMIN) 100 MCG tablet Take 100 mcg by mouth daily.    Marland Kitchen. buPROPion (WELLBUTRIN XL) 150 MG 24 hr tablet Take 1 tablet (150 mg total) by mouth daily. 30 tablet 5  . nystatin (MYCOSTATIN) powder Apply topically 2 (two) times daily. (Patient not taking: Reported on 12/29/2016) 15 g 3  . omeprazole (PRILOSEC) 20 MG capsule Take 1 capsule (20 mg total) by mouth daily. (Patient not taking: Reported on 12/29/2016) 30 capsule 3   No facility-administered medications prior to visit.     Review of Systems;  Patient denies headache, fevers, malaise, unintentional weight loss, skin rash, eye pain, sinus congestion  and sinus pain, sore throat, dysphagia,  hemoptysis , cough, dyspnea, wheezing, chest pain, palpitations, orthopnea, edema, abdominal pain, nausea, melena, diarrhea, constipation, flank pain, dysuria, hematuria, urinary  Frequency, nocturia, numbness, tingling, seizures,  Focal weakness, Loss of consciousness,  Tremor, insomnia, depression, anxiety, and suicidal ideation.      Objective:  BP 118/70 (BP Location: Left Arm, Patient Position: Sitting, Cuff Size: Large)   Pulse 88   Temp 97.9 F (36.6 C) (Oral)   Resp 16   Ht 6\' 4"  (1.93 m)   Wt 275 lb 12.8 oz (125.1 kg)   SpO2 96%   BMI 33.57 kg/m   BP Readings from Last 3 Encounters:  12/29/16 118/70  07/28/16 107/70    07/01/16 128/86    Wt Readings from Last 3 Encounters:  12/29/16 275 lb 12.8 oz (125.1 kg)  07/28/16 264 lb 9.6 oz (120 kg)  07/01/16 259 lb 12.8 oz (117.8 kg)    General appearance: alert, cooperative and appears stated age Ears: normal TM's and external ear canals both ears Throat: lips, mucosa, and tongue normal; teeth and gums normal Neck: no adenopathy, no carotid bruit, supple, symmetrical, trachea midline and thyroid not enlarged, symmetric, no tenderness/mass/nodules Back: symmetric, no curvature. ROM normal. No CVA tenderness. Lungs: clear to auscultation bilaterally Heart: regular rate and rhythm, S1, S2 normal, no murmur, click, rub or gallop Abdomen: soft, non-tender; bowel sounds normal; no masses,  no organomegaly Pulses: 2+ and symmetric Skin: Skin color, texture, turgor normal. No rashes or lesions Lymph nodes: Cervical, supraclavicular, and axillary nodes normal.  Lab Results  Component Value Date   HGBA1C 5.1 03/31/2016   HGBA1C 4.9 09/24/2012    Lab Results  Component Value Date   CREATININE 0.92 12/29/2016   CREATININE 0.85 03/31/2016   CREATININE 0.74 12/12/2014    Lab Results  Component Value Date   WBC 6.1 12/29/2016   HGB 12.1 (L) 12/29/2016   HCT 37.2 (L) 12/29/2016   PLT 226.0 12/29/2016   GLUCOSE 106 (H) 12/29/2016   CHOL 205 (H) 03/31/2016   TRIG 90.0 03/31/2016   HDL 74.00 03/31/2016   LDLCALC 113 (H) 03/31/2016   ALT 21 12/29/2016   AST 48 (H) 12/29/2016   NA 134 (L) 12/29/2016   K 3.9 12/29/2016   CL 101 12/29/2016   CREATININE 0.92 12/29/2016   BUN 18 12/29/2016   CO2 27 12/29/2016   TSH 2.88 12/29/2016   PSA 0.54 03/31/2016   HGBA1C 5.1 03/31/2016    US Venous Img Lower Unilateral Left  Result Date: 12/13/2015 CLINICAL DATA:  Left lower extremity pain. EXAM: Left LOWER EXTREMITY VENOUS DOPPLER ULTRASOUND TECHNIQUE: Gray-scale sonography with graded compression, as well as color Doppler and duplex ultrasound were performed  to evaluate the lower extremity deep venous systems from the level of the common femoral vein and including the common femoral, femoral, profunda femoral, popliteal and calf veins including the posterior tibial, peroneal and gastrocnemius veins when visible. The superficial great saphenous vein was also interrogated. Spectral Doppler was utilized to evaluate flow at rest and with distal augmentation maneuvers in the common femoral, femoral and popliteal veins. COMPARISON:  None. FINDINGS: Contralateral Common Femoral Vein: Respiratory phasicity is normal and symmetric with the symptomatic side. No evidence of thrombus. Normal compressibility. Common Femoral Vein: No evidence of thrombus. Normal compressibility, respiratory phasicity and response to augmentation. Saphenofemoral Junction: No evidence of thrombus. Normal compressibility and flow on color Doppler imaging. Profunda Femoral Vein: No evidence of thrombus. Normal compressibility and  flow on color Doppler imaging. Femoral Vein: No evidence of thrombus. Normal compressibility, respiratory phasicity and response to augmentation. Popliteal Vein: No evidence of thrombus. Normal compressibility, respiratory phasicity and response to augmentation. Calf Veins: No evidence of thrombus. Normal compressibility and flow on color Doppler imaging. Superficial Great Saphenous Vein: No evidence of thrombus. Normal compressibility and flow on color Doppler imaging. Venous Reflux:  None. Other Findings: Thrombosed superficial varicosities are noted in area of pain in left lateral upper calf. IMPRESSION: No evidence of deep venous thrombosis seen in left lower extremity. Thrombosed superficial varicosities are noted in area of pain in left lateral upper calf. Electronically Signed   By: Lupita Raider, M.D.   On: 12/13/2015 15:21    Assessment & Plan:   Problem List Items Addressed This Visit    Obesity    Continue weight gain after loss of 150 lbs after surgery in  2011.  Advised to Advised to add  a daily bulk forming laxative (Citrucel, Metamucil, Benefiber, Miralax , or Fibercon) 30 minutes prior to her largest meal to increase satiety.  If no improvement in one month trial of saxenda discussed       Concentration deficit    Improved but not resolved,  No apnea reported by wife  Increase wellbutrin to 300 mg xl       Anemia   Relevant Orders   Iron and TIBC (Completed)   CBC with Differential/Platelet (Completed)    Other Visit Diagnoses    Muscle cramping    -  Primary   Relevant Orders   Comprehensive metabolic panel (Completed)   TSH (Completed)      I have discontinued Mr. Havel's nystatin and omeprazole. I have also changed his buPROPion. Additionally, I am having him maintain his vitamin B-12, calcium carbonate, Multiple Vitamin (MULTIVITAMINS PO), clobetasol, cholecalciferol, Multiple Vitamins-Minerals (ZINC PO), Probiotic Product (PROBIOTIC DAILY PO), EPINEPHrine, hydrocortisone, escitalopram, and tamsulosin.  Meds ordered this encounter  Medications  . tamsulosin (FLOMAX) 0.4 MG CAPS capsule    Sig: Take 0.8 mg by mouth daily.    Refill:  1  . buPROPion (WELLBUTRIN XL) 300 MG 24 hr tablet    Sig: Take 1 tablet (300 mg total) by mouth daily.    Dispense:  30 tablet    Refill:  2    Medications Discontinued During This Encounter  Medication Reason  . nystatin (MYCOSTATIN) powder Patient has not taken in last 30 days  . omeprazole (PRILOSEC) 20 MG capsule Patient has not taken in last 30 days  . buPROPion (WELLBUTRIN XL) 150 MG 24 hr tablet Reorder    Follow-up: Return in about 6 months (around 07/01/2017).   Sherlene Shams, MD

## 2016-12-30 ENCOUNTER — Encounter: Payer: Self-pay | Admitting: Internal Medicine

## 2016-12-30 LAB — IRON AND TIBC
%SAT: 17 % (ref 15–60)
Iron: 73 ug/dL (ref 50–180)
TIBC: 422 ug/dL (ref 250–425)
UIBC: 349 ug/dL

## 2016-12-30 NOTE — Assessment & Plan Note (Signed)
Continue weight gain after loss of 150 lbs after surgery in 2011.  Advised to Advised to add  a daily bulk forming laxative (Citrucel, Metamucil, Benefiber, Miralax , or Fibercon) 30 minutes prior to her largest meal to increase satiety.  If no improvement in one month trial of saxenda discussed

## 2016-12-30 NOTE — Assessment & Plan Note (Signed)
Improved but not resolved,  No apnea reported by wife  Increase wellbutrin to 300 mg xl

## 2017-01-23 DIAGNOSIS — R109 Unspecified abdominal pain: Secondary | ICD-10-CM | POA: Diagnosis not present

## 2017-01-23 DIAGNOSIS — Z9884 Bariatric surgery status: Secondary | ICD-10-CM | POA: Diagnosis not present

## 2017-01-28 DIAGNOSIS — N401 Enlarged prostate with lower urinary tract symptoms: Secondary | ICD-10-CM | POA: Diagnosis not present

## 2017-01-28 DIAGNOSIS — R3913 Splitting of urinary stream: Secondary | ICD-10-CM | POA: Diagnosis not present

## 2017-01-28 DIAGNOSIS — N138 Other obstructive and reflux uropathy: Secondary | ICD-10-CM | POA: Insufficient documentation

## 2017-01-28 DIAGNOSIS — H52223 Regular astigmatism, bilateral: Secondary | ICD-10-CM | POA: Diagnosis not present

## 2017-01-28 DIAGNOSIS — H524 Presbyopia: Secondary | ICD-10-CM | POA: Diagnosis not present

## 2017-01-28 DIAGNOSIS — H5213 Myopia, bilateral: Secondary | ICD-10-CM | POA: Diagnosis not present

## 2017-01-28 MED FILL — TAMSULOSIN HCL 0.4 MG CAP: 0.4 | 30 days supply | Qty: 60 | Fill #0

## 2017-02-05 DIAGNOSIS — I83893 Varicose veins of bilateral lower extremities with other complications: Secondary | ICD-10-CM | POA: Diagnosis not present

## 2017-02-10 DIAGNOSIS — N138 Other obstructive and reflux uropathy: Secondary | ICD-10-CM | POA: Diagnosis not present

## 2017-02-10 DIAGNOSIS — N401 Enlarged prostate with lower urinary tract symptoms: Secondary | ICD-10-CM | POA: Diagnosis not present

## 2017-02-10 DIAGNOSIS — R3989 Other symptoms and signs involving the genitourinary system: Secondary | ICD-10-CM | POA: Diagnosis not present

## 2017-02-12 MED FILL — buPROPion HCL ER (XL) 300 M: 300 | 30 days supply | Qty: 30 | Fill #0

## 2017-02-20 ENCOUNTER — Other Ambulatory Visit: Payer: Self-pay | Admitting: Internal Medicine

## 2017-02-24 DIAGNOSIS — I83893 Varicose veins of bilateral lower extremities with other complications: Secondary | ICD-10-CM | POA: Diagnosis not present

## 2017-02-26 MED FILL — ESCITALOPRAM 10 MG TABLET: 10 | 30 days supply | Qty: 30 | Fill #0

## 2017-03-10 DIAGNOSIS — I83893 Varicose veins of bilateral lower extremities with other complications: Secondary | ICD-10-CM | POA: Diagnosis not present

## 2017-03-16 MED FILL — TAMSULOSIN HCL 0.4 MG CAP: 0.4 | 30 days supply | Qty: 60 | Fill #1

## 2017-03-16 MED FILL — BUPROPION HCL XL 300 MG TAB: 300 | 30 days supply | Qty: 30 | Fill #1

## 2017-03-18 DIAGNOSIS — I83893 Varicose veins of bilateral lower extremities with other complications: Secondary | ICD-10-CM | POA: Diagnosis not present

## 2017-03-25 ENCOUNTER — Other Ambulatory Visit: Payer: Self-pay | Admitting: Internal Medicine

## 2017-03-26 MED FILL — ESCITALOPRAM 10 MG TABLET: 10 | 30 days supply | Qty: 30 | Fill #0

## 2017-04-03 IMAGING — US US EXTREM LOW VENOUS*L*
1 series · 13 of 24 positions shown · non-contrast
Comparison: None.

CLINICAL DATA: Left lower extremity pain.



[Series 1: us extrem low venous*left* · 0.08mm/px · 13 of 38 slices shown]
[im 1/38]
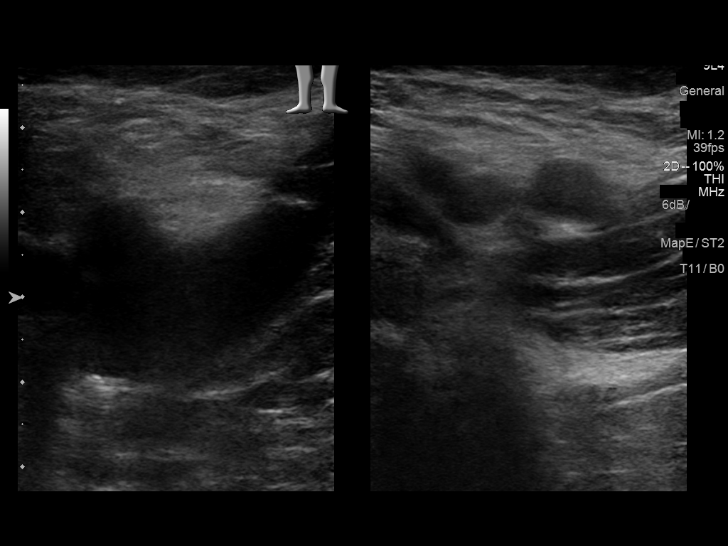
[im 4/38]
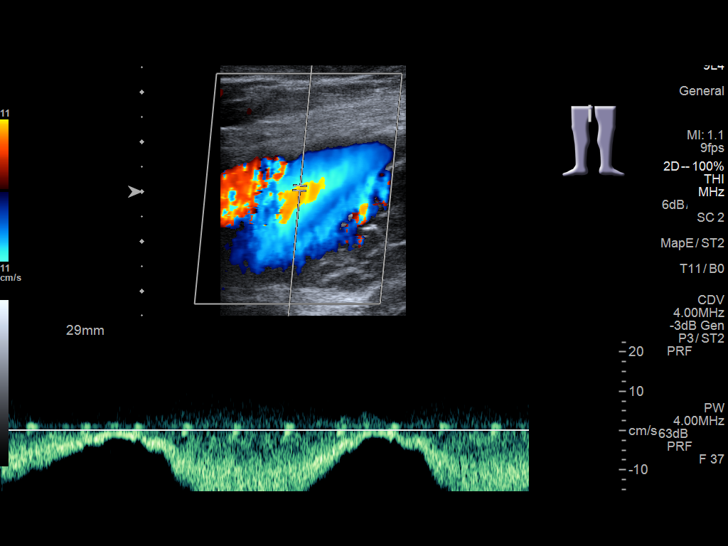
[im 7/38]
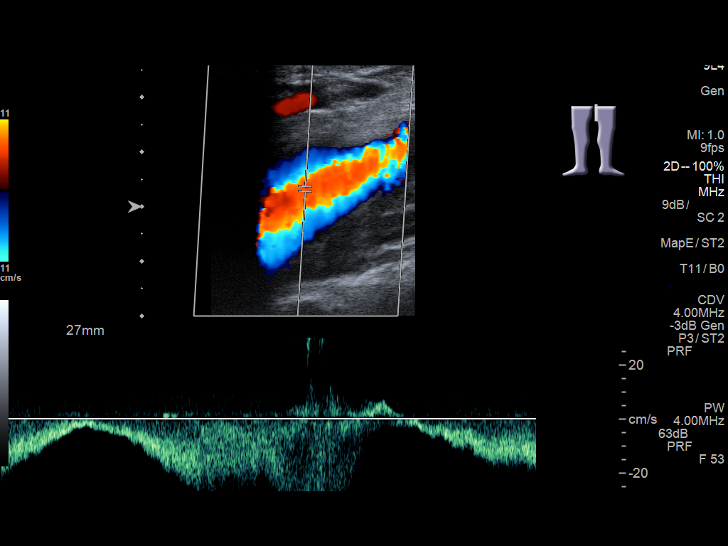
[im 10/38]
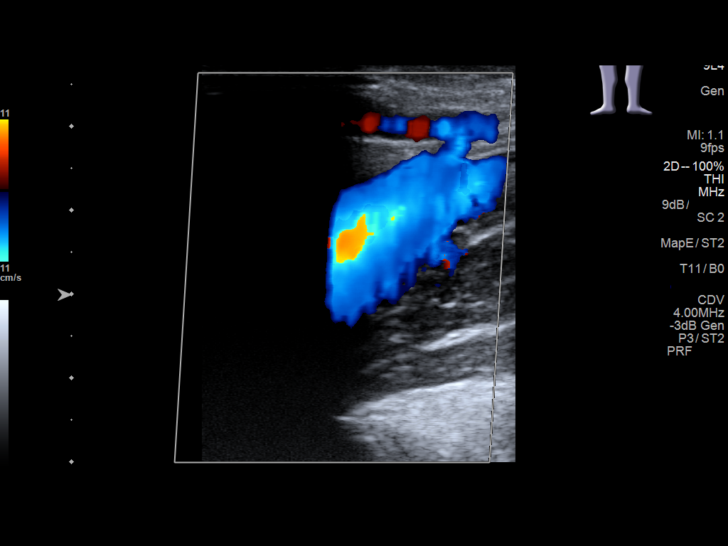
[im 13/38]
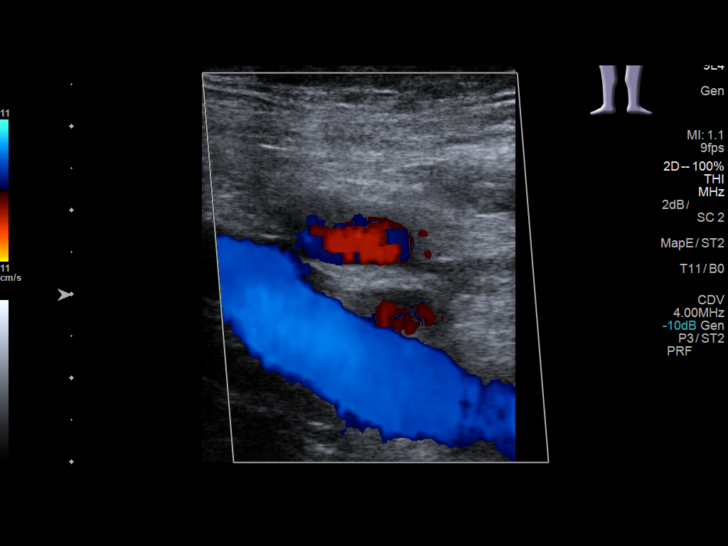
[im 17/38]
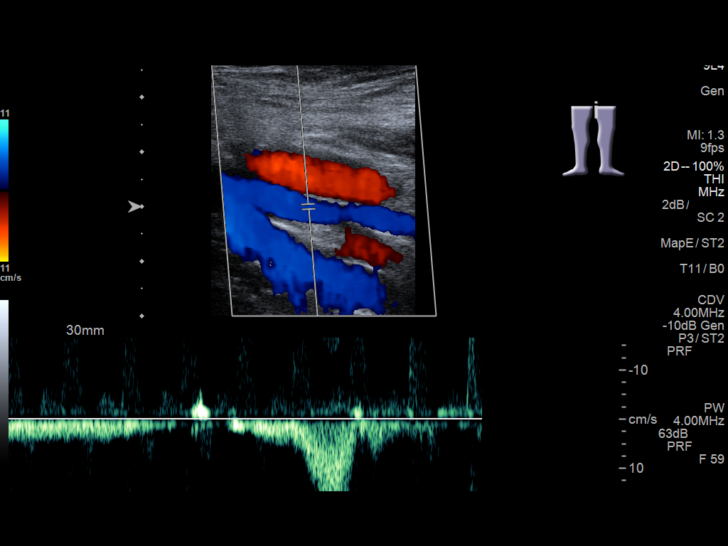
[im 20/38]
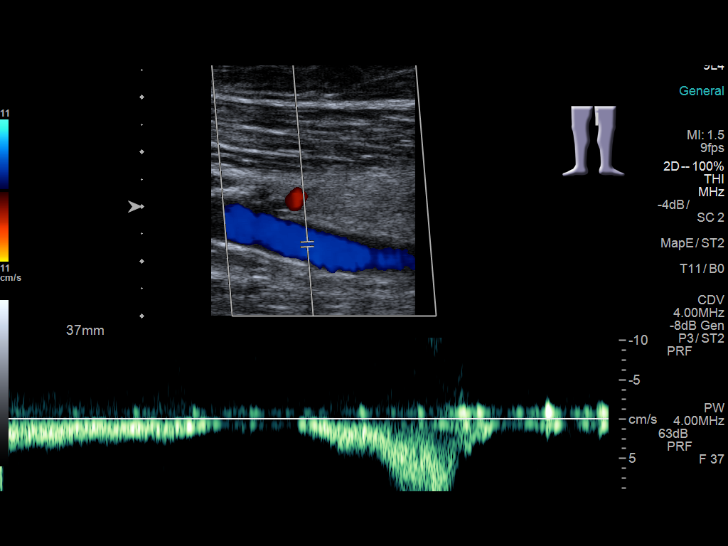
[im 21/38]
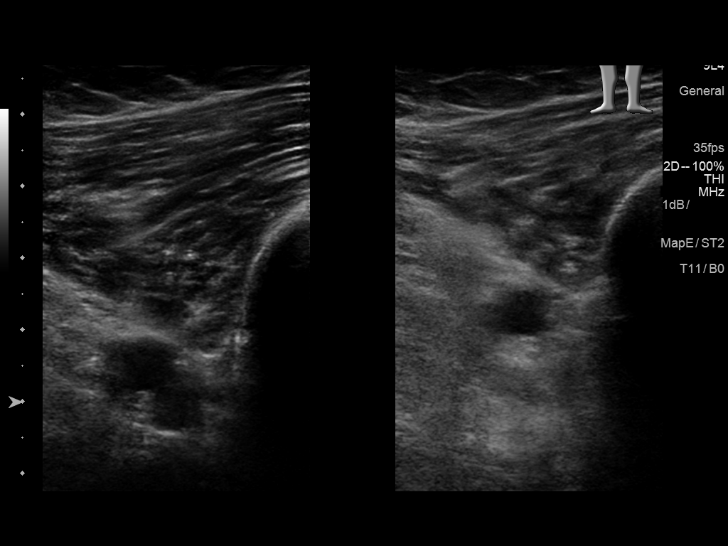
[im 25/38]
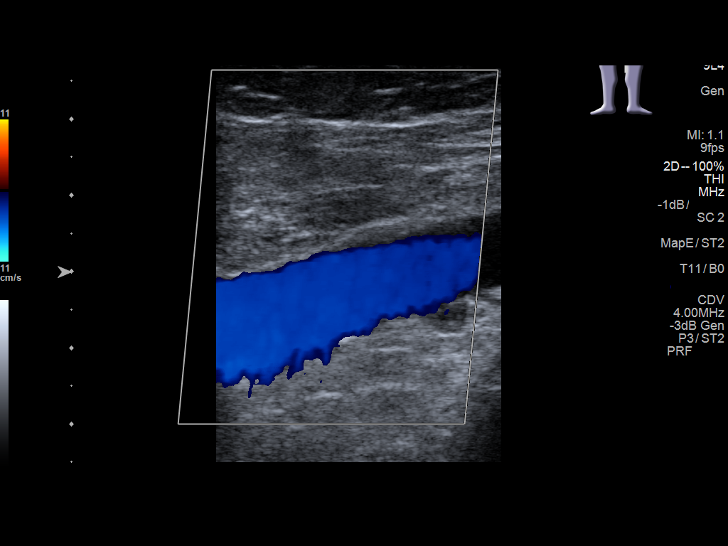
[im 28/38]
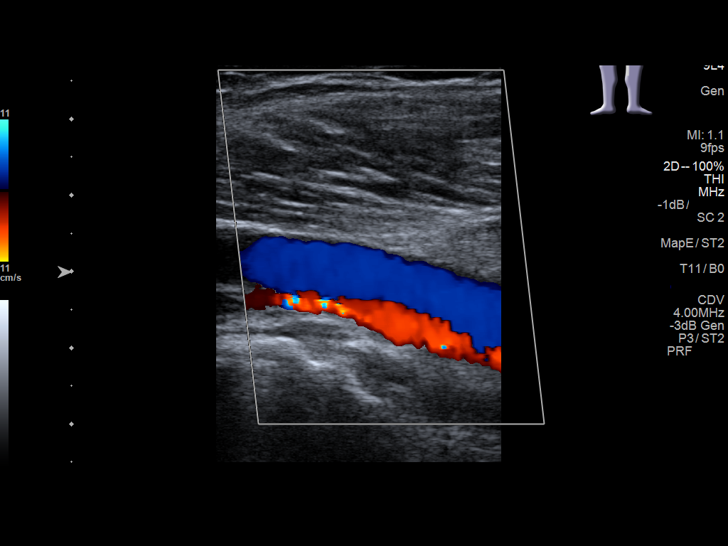
[im 31/38]
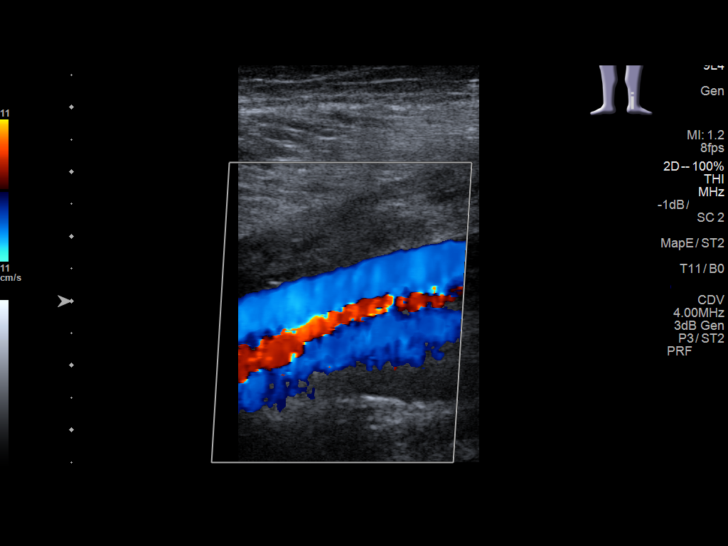
[im 34/38]
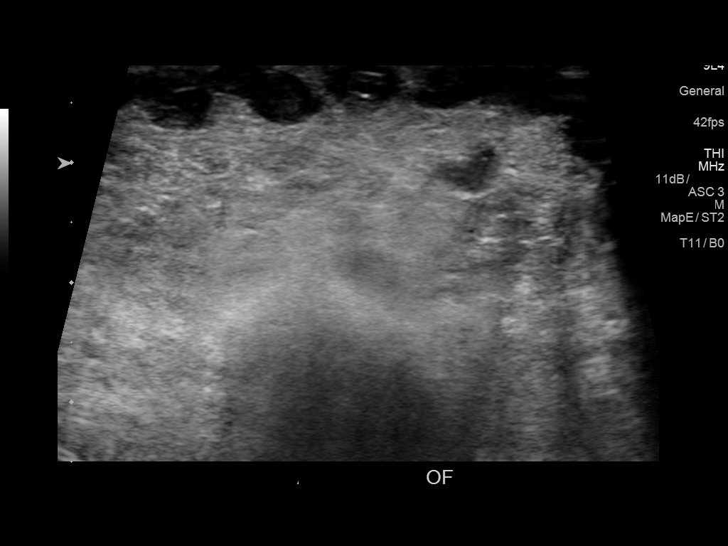
[im 38/38]
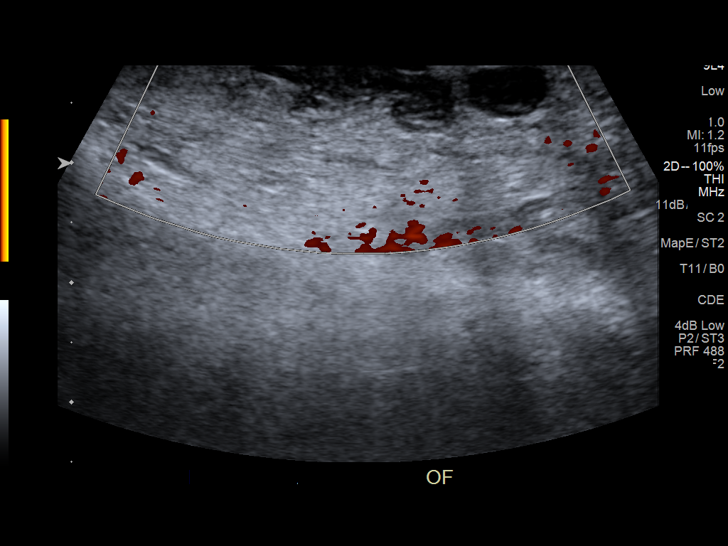

[13 of 24 positions shown; findings below may reference images not displayed]

FINDINGS: Contralateral Common Femoral Vein: Respiratory phasicity is normal
and symmetric with the symptomatic side. No evidence of thrombus.
Normal compressibility.

Common Femoral Vein: No evidence of thrombus. Normal
compressibility, respiratory phasicity and response to augmentation.

Saphenofemoral Junction: No evidence of thrombus. Normal
compressibility and flow on color Doppler imaging.

Profunda Femoral Vein: No evidence of thrombus. Normal
compressibility and flow on color Doppler imaging.

Femoral Vein: No evidence of thrombus. Normal compressibility,
respiratory phasicity and response to augmentation.

Popliteal Vein: No evidence of thrombus. Normal compressibility,
respiratory phasicity and response to augmentation.

Calf Veins: No evidence of thrombus. Normal compressibility and flow
on color Doppler imaging.

Superficial Great Saphenous Vein: No evidence of thrombus. Normal
compressibility and flow on color Doppler imaging.

Venous Reflux:  None.

Other Findings: Thrombosed superficial varicosities are noted in
area of pain in left lateral upper calf.
IMPRESSION: No evidence of deep venous thrombosis seen in left lower extremity.
Thrombosed superficial varicosities are noted in area of pain in
left lateral upper calf.

## 2017-04-13 ENCOUNTER — Other Ambulatory Visit: Payer: Self-pay | Admitting: Internal Medicine

## 2017-04-13 MED FILL — TAMSULOSIN HCL 0.4 MG CAP: 0.4 | 30 days supply | Qty: 60 | Fill #2

## 2017-04-13 MED FILL — BUPROPION HCL XL 300 MG TAB: 300 | 30 days supply | Qty: 30 | Fill #0

## 2017-04-27 ENCOUNTER — Other Ambulatory Visit: Payer: Self-pay | Admitting: Internal Medicine

## 2017-04-28 MED FILL — ESCITALOPRAM 10 MG TABLET: 10 | 30 days supply | Qty: 30 | Fill #0

## 2017-05-11 MED FILL — BUPROPION HCL XL 300 MG TAB: 300 | 30 days supply | Qty: 30 | Fill #1

## 2017-05-11 MED FILL — TAMSULOSIN HCL 0.4 MG CAP: 0.4 | 30 days supply | Qty: 60 | Fill #3

## 2017-05-27 ENCOUNTER — Other Ambulatory Visit: Payer: Self-pay | Admitting: Internal Medicine

## 2017-06-09 MED FILL — ESCITALOPRAM 10 MG TABLET: 10 | 30 days supply | Qty: 30 | Fill #0

## 2017-06-09 MED FILL — TAMSULOSIN HCL 0.4 MG CAP: 0.4 | 30 days supply | Qty: 60 | Fill #4

## 2017-06-18 ENCOUNTER — Other Ambulatory Visit: Payer: Self-pay | Admitting: Internal Medicine

## 2017-06-18 MED FILL — BUPROPION HCL XL 300 MG TAB: 300 | 30 days supply | Qty: 30 | Fill #0

## 2017-07-01 ENCOUNTER — Encounter: Payer: Self-pay | Admitting: Internal Medicine

## 2017-07-01 ENCOUNTER — Ambulatory Visit (INDEPENDENT_AMBULATORY_CARE_PROVIDER_SITE_OTHER): Payer: 59 | Admitting: Internal Medicine

## 2017-07-01 VITALS — BP 108/74 | HR 84 | Temp 97.9°F | Resp 15 | Ht 76.0 in | Wt 297.0 lb

## 2017-07-01 DIAGNOSIS — R7301 Impaired fasting glucose: Secondary | ICD-10-CM | POA: Diagnosis not present

## 2017-07-01 DIAGNOSIS — R41840 Attention and concentration deficit: Secondary | ICD-10-CM | POA: Diagnosis not present

## 2017-07-01 DIAGNOSIS — Z9884 Bariatric surgery status: Secondary | ICD-10-CM | POA: Diagnosis not present

## 2017-07-01 DIAGNOSIS — F411 Generalized anxiety disorder: Secondary | ICD-10-CM | POA: Diagnosis not present

## 2017-07-01 LAB — LIPID PANEL
CHOL/HDL RATIO: 3
Cholesterol: 208 mg/dL — ABNORMAL HIGH (ref 0–200)
HDL: 66.5 mg/dL (ref >39.00)
LDL Cholesterol: 123 mg/dL — ABNORMAL HIGH (ref 0–99)
NonHDL: 141.35
TRIGLYCERIDES: 94 mg/dL (ref 0.0–149.0)
VLDL: 18.8 mg/dL (ref 0.0–40.0)

## 2017-07-01 LAB — COMPREHENSIVE METABOLIC PANEL
ALK PHOS: 58 U/L (ref 39–117)
ALT: 20 U/L (ref 0–53)
AST: 32 U/L (ref 0–37)
Albumin: 4.2 g/dL (ref 3.5–5.2)
BUN: 16 mg/dL (ref 6–23)
CHLORIDE: 102 meq/L (ref 96–112)
CO2: 30 meq/L (ref 19–32)
Calcium: 9.3 mg/dL (ref 8.4–10.5)
Creatinine, Ser: 0.82 mg/dL (ref 0.40–1.50)
GFR: 131.63 mL/min (ref >60.00)
GLUCOSE: 98 mg/dL (ref 70–99)
POTASSIUM: 4 meq/L (ref 3.5–5.1)
SODIUM: 138 meq/L (ref 135–145)
TOTAL PROTEIN: 7 g/dL (ref 6.0–8.3)
Total Bilirubin: 0.8 mg/dL (ref 0.2–1.2)

## 2017-07-01 LAB — HEMOGLOBIN A1C: Hgb A1c MFr Bld: 5.2 % (ref 4.6–6.5)

## 2017-07-01 MED ORDER — TAMSULOSIN HCL 0.4 MG PO CAPS: 0.8000 mg | ORAL_CAPSULE | Freq: Every day | ORAL | 11 refills | Status: DC

## 2017-07-01 NOTE — Progress Notes (Signed)
Subjective:  Patient ID: Chase Carey, male    DOB: 07/22/1975  Age: 42 y.o. MRN: [id number]  CC: The primary encounter diagnosis was Impaired fasting glucose. Diagnoses of S/P gastric bypass, Concentration deficit, and Generalized anxiety disorder were also pertinent to this visit.  HPI AVRAHAM BENISH presents for follow up on generalized anxiety disorder with concentration deficit, and obesity  Has had a weight gain of  22 LBS [place] ,  40 LB S TOTAL [place] January 2018  NAIDR OF 232 IN 2015 AFTER BARIATRIC SURGERY  Diet reviewed : too many starches, not exercising regularly.    Mood improved with wellbutrin,  No side effects .  Sleeping well. Some concentration issues  , wonders if he could increase the dose to 450 mg    Lab Results  Component Value Date   HGBA1C 5.2 07/01/2017     Outpatient Medications Prior to Visit  Medication Sig Dispense Refill  . buPROPion (WELLBUTRIN XL) 300 MG 24 hr tablet TAKE 1 TABLET BY MOUTH DAILY. 30 tablet 1  . calcium carbonate (OS-CAL) 600 MG TABS Take 600 mg by mouth 2 (two) times daily with a meal.    . cholecalciferol (VITAMIN D) 1000 UNITS tablet Take 3,000 Units by mouth daily.     . clobetasol (OLUX) 0.05 % topical foam Apply topically 2 (two) times daily.    Marland Kitchen EPINEPHrine (EPIPEN 2-PAK) 0.3 mg/0.3 mL IJ SOAJ injection Inject 0.3 mLs (0.3 mg total) into the muscle once. 1 Device 1  . escitalopram (LEXAPRO) 10 MG tablet TAKE 1 TABLET BY MOUTH DAILY 30 tablet 5  . hydrocortisone (ANUSOL-HC) 25 MG suppository Place 1 suppository (25 mg total) rectally 2 (two) times daily. 12 suppository 5  . Multiple Vitamin (MULTIVITAMINS PO) Take by mouth daily.    . Multiple Vitamins-Minerals (ZINC PO) Take 1 tablet by mouth daily.    . Probiotic Product (PROBIOTIC DAILY PO) Take by mouth daily.    . vitamin B-12 (CYANOCOBALAMIN) 100 MCG tablet Take 100 mcg by mouth daily.    . tamsulosin (FLOMAX) 0.4 MG CAPS capsule Take 0.8 mg by mouth daily.  1    No facility-administered medications prior to visit.     Review of Systems;  Patient denies headache, fevers, malaise, unintentional weight loss, skin rash, eye pain, sinus congestion and sinus pain, sore throat, dysphagia,  hemoptysis , cough, dyspnea, wheezing, chest pain, palpitations, orthopnea, edema, abdominal pain, nausea, melena, diarrhea, constipation, flank pain, dysuria, hematuria, urinary  Frequency, nocturia, numbness, tingling, seizures,  Focal weakness, Loss of consciousness,  Tremor, insomnia, depression, anxiety, and suicidal ideation.      Objective:  BP 108/74 (BP Location: Left Arm, Patient Position: Sitting, Cuff Size: Large)   Pulse 84   Temp 97.9 F (36.6 C) (Oral)   Resp 15   Ht 6\' 4"  (1.93 m)   Wt 297 lb (134.7 kg)   SpO2 95%   BMI 36.15 kg/m   BP Readings from Last 3 Encounters:  07/01/17 108/74  12/29/16 118/70  07/28/16 107/70    Wt Readings from Last 3 Encounters:  07/01/17 297 lb (134.7 kg)  12/29/16 275 lb 12.8 oz (125.1 kg)  07/28/16 264 lb 9.6 oz (120 kg)    General appearance: alert, cooperative and appears stated age Ears: normal TM's and external ear canals both ears Throat: lips, mucosa, and tongue normal; teeth and gums normal Neck: no adenopathy, no carotid bruit, supple, symmetrical, trachea midline and thyroid not enlarged, symmetric, no  tenderness/mass/nodules Back: symmetric, no curvature. ROM normal. No CVA tenderness. Lungs: clear to auscultation bilaterally Heart: regular rate and rhythm, S1, S2 normal, no murmur, click, rub or gallop Abdomen: soft, non-tender; bowel sounds normal; no masses,  no organomegaly Pulses: 2+ and symmetric Skin: Skin color, texture, turgor normal. No rashes or lesions Lymph nodes: Cervical, supraclavicular, and axillary nodes normal.  Lab Results  Component Value Date   HGBA1C 5.2 07/01/2017   HGBA1C 5.1 03/31/2016   HGBA1C 4.9 09/24/2012    Lab Results  Component Value Date    CREATININE 0.82 07/01/2017   CREATININE 0.92 12/29/2016   CREATININE 0.85 03/31/2016    Lab Results  Component Value Date   WBC 6.1 12/29/2016   HGB 12.1 (L) 12/29/2016   HCT 37.2 (L) 12/29/2016   PLT 226.0 12/29/2016   GLUCOSE 98 07/01/2017   CHOL 208 (H) 07/01/2017   TRIG 94.0 07/01/2017   HDL 66.50 07/01/2017   LDLCALC 123 (H) 07/01/2017   ALT 20 07/01/2017   AST 32 07/01/2017   NA 138 07/01/2017   K 4.0 07/01/2017   CL 102 07/01/2017   CREATININE 0.82 07/01/2017   BUN 16 07/01/2017   CO2 30 07/01/2017   TSH 2.88 12/29/2016   PSA 0.54 03/31/2016   HGBA1C 5.2 07/01/2017    [place] Venous Img Lower Unilateral Left  Result Date: 12/13/2015 CLINICAL DATA:  Left lower extremity pain. EXAM: Left LOWER EXTREMITY VENOUS DOPPLER ULTRASOUND TECHNIQUE: Gray-scale sonography with graded compression, as well as color Doppler and duplex ultrasound were performed to evaluate the lower extremity deep venous systems from the level of the common femoral vein and including the common femoral, femoral, profunda femoral, popliteal and calf veins including the posterior tibial, peroneal and gastrocnemius veins when visible. The superficial great saphenous vein was also interrogated. Spectral Doppler was utilized to evaluate flow at rest and with distal augmentation maneuvers in the common femoral, femoral and popliteal veins. COMPARISON:  None. FINDINGS: Contralateral Common Femoral Vein: Respiratory phasicity is normal and symmetric with the symptomatic side. No evidence of thrombus. Normal compressibility. Common Femoral Vein: No evidence of thrombus. Normal compressibility, respiratory phasicity and response to augmentation. Saphenofemoral Junction: No evidence of thrombus. Normal compressibility and flow on color Doppler imaging. Profunda Femoral Vein: No evidence of thrombus. Normal compressibility and flow on color Doppler imaging. Femoral Vein: No evidence of thrombus. Normal compressibility,  respiratory phasicity and response to augmentation. Popliteal Vein: No evidence of thrombus. Normal compressibility, respiratory phasicity and response to augmentation. Calf Veins: No evidence of thrombus. Normal compressibility and flow on color Doppler imaging. Superficial Great Saphenous Vein: No evidence of thrombus. Normal compressibility and flow on color Doppler imaging. Venous Reflux:  None. Other Findings: Thrombosed superficial varicosities are noted in area of pain in left lateral upper calf. IMPRESSION: No evidence of deep venous thrombosis seen in left lower extremity. Thrombosed superficial varicosities are noted in area of pain in left lateral upper calf. Electronically Signed   By: Lupita RaiderJames  Green Jr, M.D.   On: 12/13/2015 15:21    Assessment & Plan:   Problem List Items Addressed This Visit    Concentration deficit    Managed with  wellbutrin 300 mg daily,  Increase to 450 mg as a trial       Generalized anxiety disorder    Continue lexapro.       S/P gastric bypass    After reaching a nadir of 232 lbs  In 2015,  He has regained 40 lbs .  Body mass index is 36.15 kg/m. I have addressed  BMI and recommended wt loss of 10% of body weight over the next 6 months using a low glycemic index diet and regular exercise a minimum of 5 days per week.  Printed information for [employer] given, and encouraged to resume support group meetings        Other Visit Diagnoses    Impaired fasting glucose    -  Primary   Relevant Orders   Comprehensive metabolic panel (Completed)   Hemoglobin A1c (Completed)   Lipid panel (Completed)    A total of 25 minutes of face to face time was spent with patient more than half of which was spent in counselling about the above mentioned conditions  and coordination of care   I have changed Dennard Nip R. Bonilla's tamsulosin. I am also having him maintain his vitamin B-12, calcium carbonate, Multiple Vitamin (MULTIVITAMINS PO), clobetasol,  cholecalciferol, Multiple Vitamins-Minerals (ZINC PO), Probiotic Product (PROBIOTIC DAILY PO), EPINEPHrine, hydrocortisone, escitalopram, and buPROPion.  Meds ordered this encounter  Medications  . tamsulosin (FLOMAX) 0.4 MG CAPS capsule    Sig: Take 2 capsules (0.8 mg total) by mouth daily.    Dispense:  60 capsule    Refill:  11    Medications Discontinued During This Encounter  Medication Reason  . tamsulosin (FLOMAX) 0.4 MG CAPS capsule Reorder    Follow-up: Return in about 3 months (around 09/29/2017).   Sherlene Shams, MD

## 2017-07-01 NOTE — Patient Instructions (Addendum)
1) The wellbutrin XL can be increased to 450 mg daily if you have more days of decreased concentration    2)  It's time to address the weight gain.  You have regained 65 lbs since 2015  I recommend you start attending a support group,  Either  Through your surgeon's office or the one in [place] ("overeater's anonymous"  You also ned to start doing 30 minutes of cardio on the days you are not lifting weights.  Watch your carbohydrate intake.  Meals should be < 20 net carbs (total carbs minus fiber grams)   Cut out bread, rice potatoes,  Chips and pasta when you can and increase your intake of green vegetables and salads.     Vilma MeckelJimmy Dean now makes a frozen breakfast frittata and an "egg sandwhich "  that can be microwaved in 2 minutes and is very low carb. Frittats are similar to quiches without the crust  Stop eating bananas  . Choose lower carb fruits

## 2017-07-03 MED FILL — TAMSULOSIN HCL 0.4 MG CAP: 0.4 | 30 days supply | Qty: 60 | Fill #0 | Status: TO

## 2017-07-04 NOTE — Assessment & Plan Note (Signed)
Continue lexapro  ?

## 2017-07-04 NOTE — Assessment & Plan Note (Signed)
After reaching a nadir of 232 lbs  In 2015,  He has regained 40 lbs .  Body mass index is 36.15 kg/m. I have addressed  BMI and recommended wt loss of 10% of body weight over the next 6 months using a low glycemic index diet and regular exercise a minimum of 5 days per week.  Printed information for [employer] meetings given, and encouraged to resume support group meetings

## 2017-07-04 NOTE — Assessment & Plan Note (Signed)
Managed with  wellbutrin 300 mg daily,  Increase to 450 mg as a trial

## 2017-07-06 MED FILL — ESCITALOPRAM 10 MG TABLET: 10 | 30 days supply | Qty: 30 | Fill #1 | Status: TO

## 2017-07-15 MED FILL — BUPROPION HCL XL 300 MG TAB: 300 | 30 days supply | Qty: 30 | Fill #1

## 2017-07-21 ENCOUNTER — Encounter: Payer: Self-pay | Admitting: Internal Medicine

## 2017-07-22 ENCOUNTER — Telehealth: Payer: Self-pay | Admitting: Internal Medicine

## 2017-07-22 NOTE — Telephone Encounter (Signed)
PT dropped off papers that need to be filled out by Dr. Darrick Huntsmanullo. Pt is starting a new job. Papers are up front in Dr. Melina Schoolsullo's color folder.

## 2017-07-22 NOTE — Telephone Encounter (Signed)
Placed in red folder  

## 2017-07-23 NOTE — Telephone Encounter (Signed)
Signed, returned,  No charge

## 2017-07-24 NOTE — Telephone Encounter (Signed)
Placed up front for pickup ° °

## 2017-08-11 ENCOUNTER — Other Ambulatory Visit: Payer: Self-pay | Admitting: Internal Medicine

## 2017-09-30 ENCOUNTER — Ambulatory Visit: Payer: 59 | Admitting: Internal Medicine

## 2017-10-27 ENCOUNTER — Other Ambulatory Visit: Payer: Self-pay

## 2017-10-27 ENCOUNTER — Telehealth: Payer: Self-pay | Admitting: Internal Medicine

## 2017-10-27 ENCOUNTER — Other Ambulatory Visit: Payer: Self-pay | Admitting: Internal Medicine

## 2017-10-27 ENCOUNTER — Ambulatory Visit (INDEPENDENT_AMBULATORY_CARE_PROVIDER_SITE_OTHER): Payer: BLUE CROSS/BLUE SHIELD | Admitting: Internal Medicine

## 2017-10-27 ENCOUNTER — Encounter: Payer: Self-pay | Admitting: Internal Medicine

## 2017-10-27 ENCOUNTER — Ambulatory Visit
Admission: RE | Admit: 2017-10-27 | Discharge: 2017-10-27 | Disposition: A | Discharge status: Home / Self Care | Payer: BLUE CROSS/BLUE SHIELD | Source: Ambulatory Visit | Attending: Internal Medicine | Admitting: Internal Medicine

## 2017-10-27 DIAGNOSIS — R310 Gross hematuria: Secondary | ICD-10-CM | POA: Diagnosis not present

## 2017-10-27 DIAGNOSIS — Z9884 Bariatric surgery status: Secondary | ICD-10-CM | POA: Insufficient documentation

## 2017-10-27 DIAGNOSIS — R1031 Right lower quadrant pain: Secondary | ICD-10-CM

## 2017-10-27 DIAGNOSIS — R109 Unspecified abdominal pain: Secondary | ICD-10-CM

## 2017-10-27 LAB — POCT URINALYSIS DIPSTICK
Bilirubin, UA: NEGATIVE
Glucose, UA: NEGATIVE
Ketones, UA: NEGATIVE
Nitrite, UA: NEGATIVE
PROTEIN UA: NEGATIVE
RBC UA: NEGATIVE
Spec Grav, UA: 1.015 (ref 1.010–1.025)
Urobilinogen, UA: 1 E.U./dL
pH, UA: 7 (ref 5.0–8.0)

## 2017-10-27 MED ORDER — SULFAMETHOXAZOLE-TRIMETHOPRIM 800-160 MG PO TABS: 1.0000 | ORAL_TABLET | Freq: Two times a day (BID) | ORAL | 0 refills | Status: DC

## 2017-10-27 NOTE — Progress Notes (Unsigned)
cbc

## 2017-10-27 NOTE — Telephone Encounter (Signed)
Spoke with pt and he is coming in at 5:00pm to see Dr. Darrick Huntsman and have his urine checked. The pt is also aware that Dr. Darrick Huntsman is ordering him a stat CT to rule out a kidney stone. The pt is aware that he needs to keep his phone on him so they can call and schedule this for him. Pt gave a verbal understanding.

## 2017-10-27 NOTE — Patient Instructions (Signed)
I am treating you for prostatitis since your CT scan did NOT show a kidney stone or kidney infection   Take the Septra two times daily after eating breakfast and dinner    Please take a probiotic ( Align, [place] or Culturelle) , or the storebrand c version of one of these  For a minimum of 3 weeks to prevent a serious antibiotic associated diarrhea  Called clostridium dificile colitis

## 2017-10-27 NOTE — Progress Notes (Signed)
Subjective:  Patient ID: Chase Carey, male    DOB: [birth year]  Age: 42 y.o. MRN: [id number]  CC: Diagnoses of Acute flank pain, Right lower quadrant abdominal pain, Gross hematuria, and Hematuria, gross were pertinent to this visit.  HPI Chase Carey presents for 2 week history of right sided CVA pain , intermittent and sharp pain .  Several days ago started feeling malaised,  Headache  fevers and chills , followed by blood tinged urine on 2 occasions on same day last Sunday (no hematuria since Sunday )  Denies diarrhea,  Occasional feelings of incomplete emptying of bladder for 6 months  Along with hesitancy. Taking flomax.    Outpatient Medications Prior to Visit  Medication Sig Dispense Refill  . buPROPion (WELLBUTRIN XL) 300 MG 24 hr tablet TAKE 1 TABLET BY MOUTH DAILY. 90 tablet 1  . calcium carbonate (OS-CAL) 600 MG TABS Take 600 mg by mouth 2 (two) times daily with a meal.    . cholecalciferol (VITAMIN D) 1000 UNITS tablet Take 3,000 Units by mouth daily.     . clobetasol (OLUX) 0.05 % topical foam Apply topically 2 (two) times daily.    Marland Kitchen EPINEPHrine (EPIPEN 2-PAK) 0.3 mg/0.3 mL IJ SOAJ injection Inject 0.3 mLs (0.3 mg total) into the muscle once. 1 Device 1  . escitalopram (LEXAPRO) 10 MG tablet TAKE 1 TABLET BY MOUTH DAILY 30 tablet 5  . hydrocortisone (ANUSOL-HC) 25 MG suppository Place 1 suppository (25 mg total) rectally 2 (two) times daily. 12 suppository 5  . Multiple Vitamin (MULTIVITAMINS PO) Take by mouth daily.    . Multiple Vitamins-Minerals (ZINC PO) Take 1 tablet by mouth daily.    . Probiotic Product (PROBIOTIC DAILY PO) Take by mouth daily.    . tamsulosin (FLOMAX) 0.4 MG CAPS capsule Take 2 capsules (0.8 mg total) by mouth daily. 60 capsule 11  . vitamin B-12 (CYANOCOBALAMIN) 100 MCG tablet Take 100 mcg by mouth daily.     No facility-administered medications prior to visit.     Review of Systems;  Patient denies  unintentional weight loss, skin rash,  eye pain, sinus congestion and sinus pain, sore throat, dysphagia,  hemoptysis , cough, dyspnea, wheezing, chest pain, palpitations, orthopnea, edema, abdominal pain, nausea, melena, diarrhea, constipation, flank pain, dysuria, hematuria, urinary  Frequency, nocturia, numbness, tingling, seizures,  Focal weakness, Loss of consciousness,  Tremor, insomnia, depression, anxiety, and suicidal ideation.      Objective:  BP 118/84 (BP Location: Left Arm, Patient Position: Sitting, Cuff Size: Normal)   Pulse 91   Temp 98.4 F (36.9 C) (Oral)   Wt 285 lb 9.6 oz (129.5 kg)   SpO2 95%   BMI 34.76 kg/m   BP Readings from Last 3 Encounters:  10/27/17 118/84  07/01/17 108/74  12/29/16 118/70    Wt Readings from Last 3 Encounters:  10/27/17 285 lb 9.6 oz (129.5 kg)  07/01/17 297 lb (134.7 kg)  12/29/16 275 lb 12.8 oz (125.1 kg)    General appearance: alert, cooperative and appears stated age Ears: normal TM's and external ear canals both ears Throat: lips, mucosa, and tongue normal; teeth and gums normal Neck: no adenopathy, no carotid bruit, supple, symmetrical, trachea midline and thyroid not enlarged, symmetric, no tenderness/mass/nodules Back: symmetric, no curvature. ROM normal. No CVA tenderness. Lungs: clear to auscultation bilaterally Heart: regular rate and rhythm, S1, S2 normal, no murmur, click, rub or gallop Abdomen: soft, non-tender; bowel sounds normal; no masses,  no organomegaly Pulses: 2+  and symmetric Skin: Skin color, texture, turgor normal. No rashes or lesions Lymph nodes: Cervical, supraclavicular, and axillary nodes normal.  Lab Results  Component Value Date   HGBA1C 5.2 07/01/2017   HGBA1C 5.1 03/31/2016   HGBA1C 4.9 09/24/2012    Lab Results  Component Value Date   CREATININE 0.86 10/27/2017   CREATININE 0.82 07/01/2017   CREATININE 0.92 12/29/2016    Lab Results  Component Value Date   WBC 8.0 10/27/2017   HGB 12.1 (L) 10/27/2017   HCT 35.9 (L)  10/27/2017   PLT 207.0 10/27/2017   GLUCOSE 116 (H) 10/27/2017   CHOL 208 (H) 07/01/2017   TRIG 94.0 07/01/2017   HDL 66.50 07/01/2017   LDLCALC 123 (H) 07/01/2017   ALT 14 10/27/2017   AST 24 10/27/2017   NA 138 10/27/2017   K 4.2 10/27/2017   CL 102 10/27/2017   CREATININE 0.86 10/27/2017   BUN 15 10/27/2017   CO2 28 10/27/2017   TSH 2.88 12/29/2016   PSA 0.54 03/31/2016   HGBA1C 5.2 07/01/2017    Ct Renal Stone Study  Result Date: 10/27/2017 CLINICAL DATA:  42 year old male with 1 week of right flank pain and 3 days of hematuria. Prior hernia repair and gastric bypass. EXAM: CT ABDOMEN AND PELVIS WITHOUT CONTRAST TECHNIQUE: Multidetector CT imaging of the abdomen and pelvis was performed following the standard protocol without IV contrast. COMPARISON:  CT Abdomen and Pelvis 01/17/2014 FINDINGS: Lower chest: Stable and negative lung bases. No pericardial or pleural effusion. Hepatobiliary: Negative noncontrast liver and gallbladder. Pancreas: Negative. Spleen: Negative. Adrenals/Urinary Tract: Stable mild adrenal gland thickening such as due to adrenal hyperplasia. Negative noncontrast left kidney. The proximal left ureter appears negative. The distal course of both ureters is difficult to delineate. There is no definite right hydronephrosis or perinephric stranding. The proximal right ureter appears within normal limits, no periureteral stranding. No urologic calculus is identified. The urinary bladder is largely decompressed and negative. Stomach/Bowel: Mild to moderate retained stool in the rectum with decompressed distal sigmoid colon. However, the sigmoid is redundant tracking into the left upper quadrant but negative aside from mild to moderate retained stool which continues into the descending colon. Redundant transverse colon with similar retained stool. Mildly redundant right colon with similar retained stool. Normal appendix (series 2, image 50. No large bowel inflammation. Negative  terminal ileum. There is some flocculated material in the distal small bowel. Sequelae of gastric bypass with mildly increased fluid in the bypassed portion of the stomach. Still, the bypassed portion is fairly decompressed. Negative duodenum. Roux-en-Y type mid epigastric small bowel anastomosis is mildly patulous but appears stable. No dilated small bowel. No definite mesenteric inflammation. No abdominal free air or free fluid. Vascular/Lymphatic: Vascular patency is not evaluated in the absence of IV contrast. Mild calcified iliac artery atherosclerosis. Abdominal and pelvic lymph nodes appear stable and within normal limits. Reproductive: Negative. Other: No pelvic free fluid. Musculoskeletal: Stable.  No acute osseous abnormality identified. IMPRESSION: 1. No urologic calculus identified. No secondary signs of obstructive uropathy. 2. No acute or inflammatory process identified in the non-contrast abdomen or pelvis. 3. Normal appendix. No bowel obstruction. Prior Roux-en-Y type gastric bypass. Electronically Signed   By: Odessa Fleming M.D.   On: 10/27/2017 15:44    Assessment & Plan:   Problem List Items Addressed This Visit    Hematuria, gross    Will treat for prostatitis given CT neg for stones.  Septra DS x 2 weeks.  Probiotic advised  Other Visit Diagnoses    Acute flank pain       Relevant Orders   POCT Urinalysis Dipstick (Completed)   Right lower quadrant abdominal pain       Gross hematuria        A total of 25 minutes of face to face time was spent with patient more than half of which was spent in counselling about the above mentioned conditions  and coordination of care   I am having Wallene Huh. [place] "Virgel Gess" start on sulfamethoxazole-trimethoprim. I am also having him maintain his vitamin B-12, calcium carbonate, Multiple Vitamin (MULTIVITAMINS PO), clobetasol, cholecalciferol, Multiple Vitamins-Minerals (ZINC PO), Probiotic Product (PROBIOTIC DAILY PO), EPINEPHrine,  hydrocortisone, escitalopram, tamsulosin, and buPROPion.  Meds ordered this encounter  Medications  . sulfamethoxazole-trimethoprim (BACTRIM DS,SEPTRA DS) 800-160 MG tablet    Sig: Take 1 tablet by mouth 2 (two) times daily.    Dispense:  28 tablet    Refill:  0    There are no discontinued medications.  Follow-up: No follow-ups on file.   Sherlene Shams, MD

## 2017-10-27 NOTE — Telephone Encounter (Signed)
Received call report from [facility] CT dept Romeo Apple):  IMPRESSION: 1. No urologic calculus identified. No secondary signs of obstructive uropathy. 2. No acute or inflammatory process identified in the non-contrast abdomen or pelvis. 3. Normal appendix. No bowel obstruction. Prior Roux-en-Y type gastric bypass.

## 2017-10-27 NOTE — Telephone Encounter (Signed)
Dr. Darrick Huntsman has received report

## 2017-10-28 LAB — URINALYSIS, ROUTINE W REFLEX MICROSCOPIC
Bilirubin Urine: NEGATIVE
HGB URINE DIPSTICK: NEGATIVE
Ketones, ur: NEGATIVE
LEUKOCYTES UA: NEGATIVE
NITRITE: NEGATIVE
RBC / HPF: NONE SEEN (ref 0–?)
Specific Gravity, Urine: 1.015 (ref 1.000–1.030)
TOTAL PROTEIN, URINE-UPE24: NEGATIVE
Urine Glucose: NEGATIVE
Urobilinogen, UA: 1 (ref 0.0–1.0)
pH: 7 (ref 5.0–8.0)

## 2017-10-28 LAB — CBC WITH DIFFERENTIAL/PLATELET
BASOS PCT: 0.5 % (ref 0.0–3.0)
Basophils Absolute: 0 10*3/uL (ref 0.0–0.1)
EOS PCT: 2.7 % (ref 0.0–5.0)
Eosinophils Absolute: 0.2 10*3/uL (ref 0.0–0.7)
HCT: 35.9 % — ABNORMAL LOW (ref 39.0–52.0)
HEMOGLOBIN: 12.1 g/dL — AB (ref 13.0–17.0)
LYMPHS PCT: 25.4 % (ref 12.0–46.0)
Lymphs Abs: 2 10*3/uL (ref 0.7–4.0)
MCHC: 33.7 g/dL (ref 30.0–36.0)
MCV: 98.6 fl (ref 78.0–100.0)
MONO ABS: 0.8 10*3/uL (ref 0.1–1.0)
Monocytes Relative: 10.5 % (ref 3.0–12.0)
NEUTROS ABS: 4.9 10*3/uL (ref 1.4–7.7)
Neutrophils Relative %: 60.9 % (ref 43.0–77.0)
PLATELETS: 207 10*3/uL (ref 150.0–400.0)
RBC: 3.64 Mil/uL — ABNORMAL LOW (ref 4.22–5.81)
RDW: 13.2 % (ref 11.5–15.5)
WBC: 8 10*3/uL (ref 4.0–10.5)

## 2017-10-28 LAB — COMPREHENSIVE METABOLIC PANEL
ALBUMIN: 3.8 g/dL (ref 3.5–5.2)
ALT: 14 U/L (ref 0–53)
AST: 24 U/L (ref 0–37)
Alkaline Phosphatase: 57 U/L (ref 39–117)
BUN: 15 mg/dL (ref 6–23)
CALCIUM: 8.7 mg/dL (ref 8.4–10.5)
CHLORIDE: 102 meq/L (ref 96–112)
CO2: 28 meq/L (ref 19–32)
CREATININE: 0.86 mg/dL (ref 0.40–1.50)
GFR: 124.4 mL/min (ref >60.00)
Glucose, Bld: 116 mg/dL — ABNORMAL HIGH (ref 70–99)
POTASSIUM: 4.2 meq/L (ref 3.5–5.1)
Sodium: 138 mEq/L (ref 135–145)
Total Bilirubin: 0.5 mg/dL (ref 0.2–1.2)
Total Protein: 6.6 g/dL (ref 6.0–8.3)

## 2017-10-29 DIAGNOSIS — R310 Gross hematuria: Secondary | ICD-10-CM | POA: Insufficient documentation

## 2017-10-29 LAB — URINE CULTURE
MICRO NUMBER: 90555597
SPECIMEN QUALITY:: ADEQUATE

## 2017-10-29 NOTE — Assessment & Plan Note (Signed)
Will treat for prostatitis given CT neg for stones.  Septra DS x 2 weeks.  Probiotic advised

## 2017-10-30 ENCOUNTER — Other Ambulatory Visit: Payer: Self-pay | Admitting: Internal Medicine

## 2017-10-30 DIAGNOSIS — N390 Urinary tract infection, site not specified: Secondary | ICD-10-CM

## 2017-10-30 DIAGNOSIS — R319 Hematuria, unspecified: Principal | ICD-10-CM

## 2017-10-30 NOTE — Progress Notes (Signed)
Message sent via my chart

## 2018-01-06 ENCOUNTER — Encounter: Payer: Self-pay | Admitting: Internal Medicine

## 2018-02-08 ENCOUNTER — Other Ambulatory Visit: Payer: Self-pay | Admitting: Internal Medicine

## 2018-07-02 ENCOUNTER — Other Ambulatory Visit: Payer: Self-pay | Admitting: Internal Medicine

## 2018-09-10 ENCOUNTER — Other Ambulatory Visit: Payer: Self-pay

## 2018-09-10 MED ORDER — TAMSULOSIN HCL 0.4 MG PO CAPS: 0.8000 mg | ORAL_CAPSULE | Freq: Every day | ORAL | 1 refills | Status: DC

## 2018-09-27 ENCOUNTER — Other Ambulatory Visit: Payer: Self-pay | Admitting: Internal Medicine

## 2018-12-07 ENCOUNTER — Other Ambulatory Visit: Payer: Self-pay | Admitting: Internal Medicine

## 2018-12-07 NOTE — Telephone Encounter (Signed)
Escitaloprm   Refilled: 09/27/2018 Bupropion   Refilled: 09/27/2018 Last OV: 10/27/2017 Next OV: not scheduled

## 2018-12-08 ENCOUNTER — Telehealth: Payer: Self-pay

## 2018-12-08 ENCOUNTER — Other Ambulatory Visit: Payer: Self-pay

## 2018-12-08 NOTE — Telephone Encounter (Signed)
Copied from CRM #[id number]. Topic: Appointment Scheduling - Scheduling Inquiry for Clinic >> Dec 08, 2018 12:52 PM Johnson, Chaz E wrote: Reason for CRM:  Pt would like to schedule an appt for med follow up and left knee pain/please advise 

## 2018-12-09 ENCOUNTER — Telehealth: Payer: Self-pay

## 2018-12-09 ENCOUNTER — Ambulatory Visit (INDEPENDENT_AMBULATORY_CARE_PROVIDER_SITE_OTHER): Payer: Managed Care, Other (non HMO)

## 2018-12-09 ENCOUNTER — Ambulatory Visit (INDEPENDENT_AMBULATORY_CARE_PROVIDER_SITE_OTHER): Payer: Managed Care, Other (non HMO) | Admitting: Family Medicine

## 2018-12-09 ENCOUNTER — Encounter: Payer: Self-pay | Admitting: Family Medicine

## 2018-12-09 VITALS — BP 128/88 | HR 95 | Temp 98.2°F | Resp 18 | Ht 76.0 in | Wt 306.8 lb

## 2018-12-09 DIAGNOSIS — F411 Generalized anxiety disorder: Secondary | ICD-10-CM | POA: Diagnosis not present

## 2018-12-09 DIAGNOSIS — M25562 Pain in left knee: Secondary | ICD-10-CM | POA: Diagnosis not present

## 2018-12-09 MED ORDER — METHYLPREDNISOLONE 4 MG PO TBPK: ORAL_TABLET | ORAL | 0 refills | Status: DC

## 2018-12-09 MED ORDER — BUPROPION HCL ER (XL) 300 MG PO TB24: 300.0000 mg | ORAL_TABLET | Freq: Every day | ORAL | 1 refills | Status: DC

## 2018-12-09 MED ORDER — ESCITALOPRAM OXALATE 10 MG PO TABS: 10.0000 mg | ORAL_TABLET | Freq: Every day | ORAL | 1 refills | Status: DC

## 2018-12-09 NOTE — Telephone Encounter (Signed)
Pt is scheduled with Philis Nettle, NP today.

## 2018-12-09 NOTE — Telephone Encounter (Signed)
Copied from [place] [id number]. Topic: Appointment Scheduling - Scheduling Inquiry for Clinic >> Dec 08, 2018 12:52 PM Alanda Slim E wrote: Reason for CRM:  Pt would like to schedule an appt for med follow up and left knee pain/please advise

## 2018-12-09 NOTE — Progress Notes (Signed)
Subjective:    Patient ID: Chase Carey, male    DOB: 1976/04/09, 43 y.o.   MRN: [id number]  HPI   Patient presents to clinic complaining of left knee pain that is been present for about 5 or 6 months.  Patient has been trying to deal with it, will use Tylenol on occasion and topical joint rubs like BenGay without much help in pain improvement.  Patient cannot take NSAIDs due to having gastric bypass surgery. Denies any recent injury or fall to the left knee.  States he did play sports growing up & he does walk a lot.   Patient also needs refill on Lexapro and Wellbutrin.  He has been stable on these medications for many years.  Denies any SI or HI.  Denies feeling overly anxious or to down or depressed.  States he was laid off for short period due to COVID-19, and was getting a little "stir crazy" and bored when at home which made him feel down, however now that he is back to work he is feeling much better and back to his normal self.  Patient Active Problem List   Diagnosis Date Noted  . Hematuria, gross 10/29/2017  . Anemia 04/02/2016  . Generalized anxiety disorder 04/01/2016  . Left lateral knee pain 04/01/2016  . Prostate cancer screening 02/28/2015  . Urethral stricture 02/28/2015  . S/P gastric bypass 12/14/2014  . Concentration deficit 12/12/2014  . Encounter for preventive health examination 04/12/2013  . Varicose veins with pain 04/11/2013  . Esophageal reflux 10/29/2012  . S/P vasectomy 10/29/2012  . Obesity 09/24/2012   Social History   Tobacco Use  . Smoking status: Never Smoker  . Smokeless tobacco: Never Used  Substance Use Topics  . Alcohol use: No    Alcohol/week: 0.0 standard drinks    Comment: occasional  1 - 2 times per year wine   Review of Systems  Constitutional: Negative for chills, fatigue and fever.  HENT: Negative for congestion, ear pain, sinus pain and sore throat.   Eyes: Negative.   Respiratory: Negative for cough, shortness of breath and  wheezing.   Cardiovascular: Negative for chest pain, palpitations and leg swelling.  Gastrointestinal: Negative for abdominal pain, diarrhea, nausea and vomiting.  Genitourinary: Negative for dysuria, frequency and urgency.  Musculoskeletal: +left knee pain Skin: Negative for color change, pallor and rash.  Neurological: Negative for syncope, light-headedness and headaches.  Psychiatric/Behavioral: The patient is not nervous/anxious.       Objective:   Physical Exam Vitals signs and nursing note reviewed.  Constitutional:      General: He is not in acute distress.    Appearance: He is not ill-appearing, toxic-appearing or diaphoretic.  HENT:     Head: Normocephalic and atraumatic.  Eyes:     General: No scleral icterus.    Extraocular Movements: Extraocular movements intact.     Conjunctiva/sclera: Conjunctivae normal.  Cardiovascular:     Rate and Rhythm: Normal rate and regular rhythm.     Heart sounds: Normal heart sounds.  Pulmonary:     Effort: Pulmonary effort is normal. No respiratory distress.  Musculoskeletal:        General: No deformity.     Left knee: Tenderness found.     Right lower leg: No edema.     Left lower leg: No edema.     Comments: Patient has pain in left knee at extreme limits of range.  Negative anterior and posterior drawer test.  Negative McMurray's test.  No obvious swelling or any deformity on physical exam.  Skin:    General: Skin is warm and dry.     Coloration: Skin is not jaundiced or pale.  Neurological:     General: No focal deficit present.     Mental Status: He is alert and oriented to person, place, and time.     Gait: Gait (no limping) normal.  Psychiatric:        Mood and Affect: Mood normal.        Behavior: Behavior normal.        Thought Content: Thought content normal.        Judgment: Judgment normal.     Today's Vitals   12/09/18 1530  BP: 128/88  Pulse: 95  Resp: 18  Temp: 98.2 F (36.8 C)  TempSrc: Oral  SpO2:  95%  Weight: (!) 306 lb 12.8 oz (139.2 kg)  Height: 6\' 4"  (1.93 m)   Body mass index is 37.34 kg/m.    Assessment & Plan:    A total of 25  minutes were spent face-to-face with the patient during this encounter and over half of that time was spent on counseling and coordination of care. The patient was counseled on pain control options that we have due to our limitations from the gastric bypass surgery, difference between sports medicine and orthopedics, anxiety being well controlled on current meds.  Left knee pain-suspect patient has form of osteoarthritis causing chronic knee pain.  Patient is unable to use NSAIDs due to his gastric bypass surgical history, so advised he can use Tylenol as needed.  We will also do a low-dose steroid taper down to help reduce inflammation and pain at this time.  Advised to try topical rubs such as a Biofreeze to see if this works better to help pain and also suggested he wear some sort of soft knee brace such as the [employer] brand to help add some joint stability and compression to the knee joint.  Once we have x-ray results back, we will better be able to determine if we need to refer to either sports medicine or orthopedic surgery.  Anxiety-patient's mood is stable on current doses of Lexapro and Wellbutrin.  Refill sent into pharmacy.    We will otherwise have patient keep regular follow-ups with his PCP.  Advised he can call office anytime with questions or concerns.  Aware someone will reach out to him with results of his x-ray.

## 2018-12-09 NOTE — Patient Instructions (Signed)
Try tylenol as needed for pain  We will see what xray shows and then determine if we need to refer to orthopedics   Acute Knee Pain, Adult Many things can cause knee pain. Sometimes, knee pain is sudden (acute) and may be caused by damage, swelling, or irritation of the muscles and tissues that support your knee. The pain often goes away on its own with time and rest. If the pain does not go away, tests may be done to find out what is causing the pain. Follow these instructions at home: Pay attention to any changes in your symptoms. Take these actions to relieve your pain. If you have a knee sleeve or brace:   Wear the sleeve or brace as told by your doctor. Remove it only as told by your doctor.  Loosen the sleeve or brace if your toes: ? Tingle. ? Become numb. ? Turn cold and blue.  Keep the sleeve or brace clean.  If the sleeve or brace is not waterproof: ? Do not let it get wet. ? Cover it with a watertight covering when you take a bath or shower. Activity  Rest your knee.  Do not do things that cause pain.  Avoid activities where both feet leave the ground at the same time (high-impact activities). Examples are running, jumping rope, and doing jumping jacks.  Work with a physical therapist to make a safe exercise program, as told by your doctor. Managing pain, stiffness, and swelling   If told, put ice on the knee: ? Put ice in a plastic bag. ? Place a towel between your skin and the bag. ? Leave the ice on for 20 minutes, 2-3 times a day.  If told, put pressure (compression) on your injured knee to control swelling, give support, and help with discomfort. Compression may be done with an elastic bandage. General instructions  Take all medicines only as told by your doctor.  Raise (elevate) your knee while you are sitting or lying down. Make sure your knee is higher than your heart.  Sleep with a pillow under your knee.  Do not use any products that contain  nicotine or tobacco. These include cigarettes, e-cigarettes, and chewing tobacco. These products may slow down healing. If you need help quitting, ask your doctor.  If you are overweight, work with your doctor and a food expert (dietitian) to set goals to lose weight. Being overweight can make your knee hurt more.  Keep all follow-up visits as told by your doctor. This is important. Contact a doctor if:  The knee pain does not stop.  The knee pain changes or gets worse.  You have a fever along with knee pain.  Your knee feels warm when you touch it.  Your knee gives out or locks up. Get help right away if:  Your knee swells, and the swelling gets worse.  You cannot move your knee.  You have very bad knee pain. Summary  Many things can cause knee pain. The pain often goes away on its own with time and rest.  Your doctor may do tests to find out the cause of the pain.  Pay attention to any changes in your symptoms. Relieve your pain with rest, medicines, light activity, and use of ice.  Get help right away if you cannot move your knee or your knee pain is very bad. This information is not intended to replace advice given to you by your health care provider. Make sure you discuss any questions  you have with your health care provider. Document Released: 09/05/2008 Document Revised: 11/19/2017 Document Reviewed: 11/19/2017 Elsevier Interactive Patient Education  2019 [employer].

## 2018-12-10 ENCOUNTER — Encounter: Payer: Self-pay | Admitting: Family Medicine

## 2018-12-10 NOTE — Telephone Encounter (Signed)
Called Pt and told him that the Rx for the steroid was sent 12/09/18 to the [facility] on [address] in [place]

## 2018-12-16 ENCOUNTER — Encounter: Payer: Self-pay | Admitting: Family Medicine

## 2018-12-16 DIAGNOSIS — M1712 Unilateral primary osteoarthritis, left knee: Secondary | ICD-10-CM

## 2018-12-16 DIAGNOSIS — M25562 Pain in left knee: Secondary | ICD-10-CM

## 2019-03-08 ENCOUNTER — Other Ambulatory Visit: Payer: Self-pay | Admitting: Internal Medicine

## 2019-06-30 DIAGNOSIS — F411 Generalized anxiety disorder: Secondary | ICD-10-CM

## 2019-07-04 MED ORDER — ESCITALOPRAM OXALATE 10 MG PO TABS: 10.0000 mg | ORAL_TABLET | Freq: Every day | ORAL | 0 refills | Status: DC

## 2019-07-04 MED ORDER — BUPROPION HCL ER (XL) 300 MG PO TB24: 300.0000 mg | ORAL_TABLET | Freq: Every day | ORAL | 0 refills | Status: DC

## 2019-07-18 ENCOUNTER — Ambulatory Visit: Payer: Managed Care, Other (non HMO) | Admitting: Internal Medicine

## 2019-07-25 ENCOUNTER — Encounter: Payer: Self-pay | Admitting: Internal Medicine

## 2019-07-25 ENCOUNTER — Other Ambulatory Visit: Payer: Self-pay

## 2019-07-25 ENCOUNTER — Ambulatory Visit (INDEPENDENT_AMBULATORY_CARE_PROVIDER_SITE_OTHER): Payer: Managed Care, Other (non HMO) | Admitting: Internal Medicine

## 2019-07-25 VITALS — Ht 76.0 in | Wt 300.0 lb

## 2019-07-25 DIAGNOSIS — M79604 Pain in right leg: Secondary | ICD-10-CM

## 2019-07-25 DIAGNOSIS — D649 Anemia, unspecified: Secondary | ICD-10-CM | POA: Diagnosis not present

## 2019-07-25 DIAGNOSIS — F411 Generalized anxiety disorder: Secondary | ICD-10-CM

## 2019-07-25 DIAGNOSIS — Z125 Encounter for screening for malignant neoplasm of prostate: Secondary | ICD-10-CM | POA: Diagnosis not present

## 2019-07-25 DIAGNOSIS — Z13220 Encounter for screening for lipoid disorders: Secondary | ICD-10-CM

## 2019-07-25 NOTE — Progress Notes (Signed)
Patient ID: Chase Carey, male   DOB: 1975-07-27, 44 y.o.   MRN: [id number]   Virtual Visit via telephone Note  This visit type was conducted due to national recommendations for restrictions regarding the COVID-19 pandemic (e.g. social distancing).  This format is felt to be most appropriate for this patient at this time.  All issues noted in this document were discussed and addressed.  No physical exam was performed (except for noted visual exam findings with Video Visits).   I connected with Virgel Gess today by telephone and verified that I am speaking with the correct person using two identifiers. Location patient: home Location provider: work  Persons participating in the telephone visit: patient, provider  The limitations, risks, security and privacy concerns of performing an evaluation and management service by telephone and the availability of in person appointments have been discussed. The patient expressed understanding and agreed to proceed.   Reason for visit: work in appt  HPI: Was asked to schedule an appt to continue to get medications refilled.  Is on lexapro and wellbutrin - for GAD.  States he has done well on this medication.  Tries to stay active.  No chest pain or sob.  Has not been sick.  No increased cough or congestion.  No acid reflux reported.  No abdominal pain.  Does reports occasional low back pain.  Noticed 2-3 weeks ago, pain - around groin - down leg and right shin.  Had to massage his leg - helped.  States right leg will hurt - starts in his hip/buttock and extends down right leg.  No injury or trauma.  Not constant pain.  Discussed therapy.  Massaging/stretching - seems to help.     ROS: See pertinent positives and negatives per HPI.  Past Medical History:  Diagnosis Date  . Anal fissure   . Anemia, unspecified   . Arthritis    ankle  . Asymptomatic varicose veins   . Balanoposthitis   . Cervicalgia   . Chronic headaches   . Elevated blood  pressure reading without diagnosis of hypertension   . Gallstones   . GERD (gastroesophageal reflux disease)   . Localized superficial swelling, mass, or lump   . Lumbago   . Obesity, unspecified   . Other abnormal blood chemistry   . Other dyspnea and respiratory abnormality   . Plantar fascial fibromatosis   . Psychogenic pain, site unspecified   . Seborrhea capitis   . Sleep apnea    mild  . Sprain of neck   . Tuberculosis    positive PPD; negative chest x ray- 2000, took medication for 6 months    Past Surgical History:  Procedure Laterality Date  . GASTRIC BYPASS  10/17/2009  . HEMORRHOIDECTOMY WITH HEMORRHOID BANDING  2011  . HERNIA REPAIR  03/2016   internal at [facility]  . RLE varicose vein laser procedure  2011   [facility]  . VASECTOMY  02/25/2013   no report of complications  . VEIN LIGATION AND STRIPPING Right 04/07/13   leg    Family History  Adopted: Yes  Problem Relation Age of Onset  . Other Other        adopted    SOCIAL HX: reviewed.    Current Outpatient Medications:  .  buPROPion (WELLBUTRIN XL) 300 MG 24 hr tablet, Take 1 tablet (300 mg total) by mouth daily., Disp: 90 tablet, Rfl: 0 .  calcium carbonate (OS-CAL) 600 MG TABS, Take 600 mg by mouth 2 (two) times  daily with a meal., Disp: , Rfl:  .  cholecalciferol (VITAMIN D) 1000 UNITS tablet, Take 3,000 Units by mouth daily. , Disp: , Rfl:  .  clobetasol (OLUX) 0.05 % topical foam, Apply topically 2 (two) times daily., Disp: , Rfl:  .  EPINEPHrine (EPIPEN 2-PAK) 0.3 mg/0.3 mL IJ SOAJ injection, Inject 0.3 mLs (0.3 mg total) into the muscle once., Disp: 1 Device, Rfl: 1 .  escitalopram (LEXAPRO) 10 MG tablet, Take 1 tablet (10 mg total) by mouth daily., Disp: 90 tablet, Rfl: 0 .  hydrocortisone (ANUSOL-HC) 25 MG suppository, Place 1 suppository (25 mg total) rectally 2 (two) times daily., Disp: 12 suppository, Rfl: 5 .  methylPREDNISolone (MEDROL DOSEPAK) 4 MG TBPK tablet, Take according to pack instructions,  Disp: 21 tablet, Rfl: 0 .  Multiple Vitamin (MULTIVITAMINS PO), Take by mouth daily., Disp: , Rfl:  .  Multiple Vitamins-Minerals (ZINC PO), Take 1 tablet by mouth daily., Disp: , Rfl:  .  Probiotic Product (PROBIOTIC DAILY PO), Take by mouth daily., Disp: , Rfl:  .  sulfamethoxazole-trimethoprim (BACTRIM DS,SEPTRA DS) 800-160 MG tablet, Take 1 tablet by mouth 2 (two) times daily., Disp: 28 tablet, Rfl: 0 .  tamsulosin (FLOMAX) 0.4 MG CAPS capsule, TAKE 2 CAPSULES BY MOUTH EVERY DAY, Disp: 60 capsule, Rfl: 5 .  vitamin B-12 (CYANOCOBALAMIN) 100 MCG tablet, Take 100 mcg by mouth daily., Disp: , Rfl:   EXAM:  GENERAL: alert.  Sounds to be in no acute distress.  Answering questions appropriately.    PSYCH/NEURO: pleasant and cooperative, no obvious depression or anxiety, speech and thought processing grossly intact  ASSESSMENT AND PLAN:  Discussed the following assessment and plan:  Anemia Recheck cbc.    Generalized anxiety disorder Has done well on lexapro and wellbutrin.  Continue current medication regimen.  Follow.  Feels - stable.   Right leg pain Right leg pain as outlined.  Intermittent.  Discussed PT.  He was agreeable.  No injury or trauma.  Stretches.  Tylenol.  Follow.     Orders Placed This Encounter  Procedures  . CBC with Differential/Platelet    Standing Status:   Future    Standing Expiration Date:   07/29/2020  . Comprehensive metabolic panel    Standing Status:   Future    Standing Expiration Date:   07/29/2020  . Lipid panel    Standing Status:   Future    Standing Expiration Date:   07/29/2020  . PSA    Standing Status:   Future    Standing Expiration Date:   07/29/2020  . Ambulatory referral to Physical Therapy    Referral Priority:   Routine    Referral Type:   Physical Medicine    Referral Reason:   Specialty Services Required    Requested Specialty:   Physical Therapy    Number of Visits Requested:   1    Meds ordered this encounter  Medications  .  buPROPion (WELLBUTRIN XL) 300 MG 24 hr tablet    Sig: Take 1 tablet (300 mg total) by mouth daily.    Dispense:  90 tablet    Refill:  0  . escitalopram (LEXAPRO) 10 MG tablet    Sig: Take 1 tablet (10 mg total) by mouth daily.    Dispense:  90 tablet    Refill:  0     I discussed the assessment and treatment plan with the patient. The patient was provided an opportunity to ask questions and all were answered. The  patient agreed with the plan and demonstrated an understanding of the instructions.   The patient was advised to call back or seek an in-person evaluation if the symptoms worsen or if the condition fails to improve as anticipated.  I provided 21 minutes of non-face-to-face time during this encounter.   Dale Altamont, MD

## 2019-07-30 ENCOUNTER — Encounter: Payer: Self-pay | Admitting: Internal Medicine

## 2019-07-30 DIAGNOSIS — M79604 Pain in right leg: Secondary | ICD-10-CM | POA: Insufficient documentation

## 2019-07-30 MED ORDER — ESCITALOPRAM OXALATE 10 MG PO TABS: 10.0000 mg | ORAL_TABLET | Freq: Every day | ORAL | 0 refills | Status: DC

## 2019-07-30 MED ORDER — BUPROPION HCL ER (XL) 300 MG PO TB24: 300.0000 mg | ORAL_TABLET | Freq: Every day | ORAL | 0 refills | Status: DC

## 2019-07-30 NOTE — Assessment & Plan Note (Signed)
Recheck cbc.  

## 2019-07-30 NOTE — Assessment & Plan Note (Signed)
Right leg pain as outlined.  Intermittent.  Discussed PT.  He was agreeable.  No injury or trauma.  Stretches.  Tylenol.  Follow.

## 2019-07-30 NOTE — Assessment & Plan Note (Signed)
Has done well on lexapro and wellbutrin.  Continue current medication regimen.  Follow.  Feels - stable.

## 2019-08-17 ENCOUNTER — Ambulatory Visit: Payer: Managed Care, Other (non HMO) | Admitting: Physical Therapy

## 2019-08-22 ENCOUNTER — Ambulatory Visit: Payer: Managed Care, Other (non HMO) | Admitting: Physical Therapy

## 2019-08-22 ENCOUNTER — Ambulatory Visit: Payer: Managed Care, Other (non HMO) | Attending: Internal Medicine | Admitting: Physical Therapy

## 2019-08-24 ENCOUNTER — Ambulatory Visit: Payer: Managed Care, Other (non HMO) | Admitting: Physical Therapy

## 2019-08-24 ENCOUNTER — Encounter: Payer: Managed Care, Other (non HMO) | Admitting: Physical Therapy

## 2019-08-26 ENCOUNTER — Telehealth: Payer: Self-pay

## 2019-08-26 NOTE — Telephone Encounter (Signed)
LMTCB. Need to see if pt can move his appt up since he is having mood changes.

## 2019-08-26 NOTE — Telephone Encounter (Signed)
Pt returned called and scheduled for 08/31/19 @ 9:30am.

## 2019-08-29 ENCOUNTER — Encounter: Payer: Managed Care, Other (non HMO) | Admitting: Physical Therapy

## 2019-08-31 ENCOUNTER — Encounter: Payer: Managed Care, Other (non HMO) | Admitting: Physical Therapy

## 2019-08-31 ENCOUNTER — Ambulatory Visit: Payer: Managed Care, Other (non HMO) | Admitting: Internal Medicine

## 2019-08-31 ENCOUNTER — Encounter: Payer: Self-pay | Admitting: Internal Medicine

## 2019-08-31 ENCOUNTER — Other Ambulatory Visit: Payer: Self-pay

## 2019-08-31 VITALS — BP 140/76 | HR 106 | Temp 97.9°F | Resp 15 | Ht 76.0 in | Wt 315.4 lb

## 2019-08-31 DIAGNOSIS — E669 Obesity, unspecified: Secondary | ICD-10-CM | POA: Diagnosis not present

## 2019-08-31 DIAGNOSIS — Z6833 Body mass index (BMI) 33.0-33.9, adult: Secondary | ICD-10-CM

## 2019-08-31 DIAGNOSIS — D649 Anemia, unspecified: Secondary | ICD-10-CM | POA: Diagnosis not present

## 2019-08-31 DIAGNOSIS — R0683 Snoring: Secondary | ICD-10-CM

## 2019-08-31 DIAGNOSIS — R030 Elevated blood-pressure reading, without diagnosis of hypertension: Secondary | ICD-10-CM | POA: Diagnosis not present

## 2019-08-31 DIAGNOSIS — R5383 Other fatigue: Secondary | ICD-10-CM

## 2019-08-31 DIAGNOSIS — Z125 Encounter for screening for malignant neoplasm of prostate: Secondary | ICD-10-CM | POA: Diagnosis not present

## 2019-08-31 DIAGNOSIS — E6609 Other obesity due to excess calories: Secondary | ICD-10-CM

## 2019-08-31 DIAGNOSIS — R41840 Attention and concentration deficit: Secondary | ICD-10-CM

## 2019-08-31 DIAGNOSIS — G4733 Obstructive sleep apnea (adult) (pediatric): Secondary | ICD-10-CM | POA: Insufficient documentation

## 2019-08-31 DIAGNOSIS — E66811 Obesity, class 1: Secondary | ICD-10-CM

## 2019-08-31 LAB — LIPID PANEL
Cholesterol: 203 mg/dL — ABNORMAL HIGH (ref 0–200)
HDL: 62.7 mg/dL (ref >39.00)
LDL Cholesterol: 118 mg/dL — ABNORMAL HIGH (ref 0–99)
NonHDL: 140.09
Total CHOL/HDL Ratio: 3
Triglycerides: 112 mg/dL (ref 0.0–149.0)
VLDL: 22.4 mg/dL (ref 0.0–40.0)

## 2019-08-31 LAB — TSH: TSH: 4.91 u[IU]/mL — ABNORMAL HIGH (ref 0.35–4.50)

## 2019-08-31 LAB — CBC WITH DIFFERENTIAL/PLATELET
Basophils Absolute: 0 10*3/uL (ref 0.0–0.1)
Basophils Relative: 0.3 % (ref 0.0–3.0)
Eosinophils Absolute: 0.1 10*3/uL (ref 0.0–0.7)
Eosinophils Relative: 1.8 % (ref 0.0–5.0)
HCT: 37.9 % — ABNORMAL LOW (ref 39.0–52.0)
Hemoglobin: 12.4 g/dL — ABNORMAL LOW (ref 13.0–17.0)
Lymphocytes Relative: 38.1 % (ref 12.0–46.0)
Lymphs Abs: 2.4 10*3/uL (ref 0.7–4.0)
MCHC: 32.6 g/dL (ref 30.0–36.0)
MCV: 96.6 fl (ref 78.0–100.0)
Monocytes Absolute: 0.5 10*3/uL (ref 0.1–1.0)
Monocytes Relative: 8.5 % (ref 3.0–12.0)
Neutro Abs: 3.2 10*3/uL (ref 1.4–7.7)
Neutrophils Relative %: 51.3 % (ref 43.0–77.0)
Platelets: 226 10*3/uL (ref 150.0–400.0)
RBC: 3.93 Mil/uL — ABNORMAL LOW (ref 4.22–5.81)
RDW: 13.6 % (ref 11.5–15.5)
WBC: 6.2 10*3/uL (ref 4.0–10.5)

## 2019-08-31 LAB — COMPREHENSIVE METABOLIC PANEL
ALT: 15 U/L (ref 0–53)
AST: 27 U/L (ref 0–37)
Albumin: 4 g/dL (ref 3.5–5.2)
Alkaline Phosphatase: 68 U/L (ref 39–117)
BUN: 14 mg/dL (ref 6–23)
CO2: 30 mEq/L (ref 19–32)
Calcium: 9.3 mg/dL (ref 8.4–10.5)
Chloride: 102 mEq/L (ref 96–112)
Creatinine, Ser: 0.81 mg/dL (ref 0.40–1.50)
GFR: 124.37 mL/min (ref >60.00)
Glucose, Bld: 77 mg/dL (ref 70–99)
Potassium: 3.9 mEq/L (ref 3.5–5.1)
Sodium: 137 mEq/L (ref 135–145)
Total Bilirubin: 0.7 mg/dL (ref 0.2–1.2)
Total Protein: 6.9 g/dL (ref 6.0–8.3)

## 2019-08-31 LAB — MICROALBUMIN / CREATININE URINE RATIO
Creatinine,U: 65.1 mg/dL
Microalb Creat Ratio: 1.1 mg/g (ref 0.0–30.0)
Microalb, Ur: <0.7 mg/dL (ref 0.0–1.9)

## 2019-08-31 LAB — PSA: PSA: 0.44 ng/mL (ref 0.10–4.00)

## 2019-08-31 LAB — LDL CHOLESTEROL, DIRECT: Direct LDL: 117 mg/dL

## 2019-08-31 MED ORDER — PHENTERMINE HCL 37.5 MG PO TABS: 37.5000 mg | ORAL_TABLET | Freq: Every day | ORAL | 0 refills | Status: DC

## 2019-08-31 MED ORDER — EPINEPHRINE 0.3 MG/0.3ML IJ SOAJ: 0.3000 mg | Freq: Once | INTRAMUSCULAR | 0 refills | Status: AC

## 2019-08-31 NOTE — Assessment & Plan Note (Signed)
Continue wellbutrin given concurrent anhedonia, fatigue weight gain

## 2019-08-31 NOTE — Assessment & Plan Note (Signed)
With gasping,  Weight gain,  Morning fatigue nad elevated blood pressure. sleep study needed.

## 2019-08-31 NOTE — Assessment & Plan Note (Signed)
He has gained a significant amount of weight in the last 1-2 years.  Trial of phentermine to suppress appetite.

## 2019-08-31 NOTE — Addendum Note (Signed)
Addended by: Sherlene Shams on: 08/31/2019 10:24 AM   Modules accepted: Orders

## 2019-08-31 NOTE — Assessment & Plan Note (Signed)
Checking thyroid,  Sleep study,  Cbc . Continue wellbutrin

## 2019-08-31 NOTE — Progress Notes (Signed)
Subjective:  Patient ID: Chase Carey, male    DOB: Nov 28, 1975  Age: 44 y.o. MRN: [id number]  CC: The primary encounter diagnosis was Anemia, unspecified type. Diagnoses of Obesity (BMI 30.0-34.9), Screening for prostate cancer, Elevated blood-pressure reading, without diagnosis of hypertension, Concentration deficit, Fatigue, unspecified type, Class 1 obesity due to excess calories without serious comorbidity with body mass index (BMI) of 33.0 to 33.9 in adult, and Snoring were also pertinent to this visit.  HPI ROBEN SCHLIEP presents for  Follow up on multiple issues   This visit occurred during the SARS-CoV-2 public health emergency.  Safety protocols were in place, including screening questions prior to the visit, additional usage of staff PPE, and extensive cleaning of exam room while observing appropriate contact time as indicated for disinfecting solutions.    History of Depression: states that he was doing well until 3 weeks ago, although he admits wife says it has been going on  For a longer period of time.  Feels   Irritable  No interest in sex or other activities .  Comes home from work and just does chores.  Has been doing some  Mindless eating ,  denies eating junk food,  cookd for family and cleans everybody else's plate .    No energy , , wakes up tired  Wife says she snores, wakes up gasping   Outpatient Medications Prior to Visit  Medication Sig Dispense Refill  . buPROPion (WELLBUTRIN XL) 300 MG 24 hr tablet Take 1 tablet (300 mg total) by mouth daily. 90 tablet 0  . calcium carbonate (OS-CAL) 600 MG TABS Take 600 mg by mouth 2 (two) times daily with a meal.    . cholecalciferol (VITAMIN D) 1000 UNITS tablet Take 3,000 Units by mouth daily.     . clobetasol (OLUX) 0.05 % topical foam Apply topically 2 (two) times daily.    Marland Kitchen EPINEPHrine (EPIPEN 2-PAK) 0.3 mg/0.3 mL IJ SOAJ injection Inject 0.3 mLs (0.3 mg total) into the muscle once. 1 Device 1  . escitalopram (LEXAPRO)  10 MG tablet Take 1 tablet (10 mg total) by mouth daily. 90 tablet 0  . hydrocortisone (ANUSOL-HC) 25 MG suppository Place 1 suppository (25 mg total) rectally 2 (two) times daily. 12 suppository 5  . ketoconazole (NIZORAL) 2 % shampoo SHAMPOO FACE, SCALP, AND EARS TOPICALLY TWICE A WEEK. LET LATHER SIT FOR 2 3 MINS. BEFORE RINSING.    . Multiple Vitamin (MULTIVITAMINS PO) Take by mouth daily.    . Multiple Vitamins-Minerals (ZINC PO) Take 1 tablet by mouth daily.    . Probiotic Product (PROBIOTIC DAILY PO) Take by mouth daily.    . tamsulosin (FLOMAX) 0.4 MG CAPS capsule TAKE 2 CAPSULES BY MOUTH EVERY DAY 60 capsule 5  . vitamin B-12 (CYANOCOBALAMIN) 100 MCG tablet Take 100 mcg by mouth daily.    . methylPREDNISolone (MEDROL DOSEPAK) 4 MG TBPK tablet Take according to pack instructions 21 tablet 0  . sulfamethoxazole-trimethoprim (BACTRIM DS,SEPTRA DS) 800-160 MG tablet Take 1 tablet by mouth 2 (two) times daily. (Patient not taking: Reported on 08/31/2019) 28 tablet 0   No facility-administered medications prior to visit.    Review of Systems;  Patient denies headache, fevers, malaise, unintentional weight loss, skin rash, eye pain, sinus congestion and sinus pain, sore throat, dysphagia,  hemoptysis , cough, dyspnea, wheezing, chest pain, palpitations, orthopnea, edema, abdominal pain, nausea, melena, diarrhea, constipation, flank pain, dysuria, hematuria, urinary  Frequency, nocturia, numbness, tingling, seizures,  Focal weakness,  Loss of consciousness,  Tremor, insomnia, depression, anxiety, and suicidal ideation.      Objective:  BP 140/76 (BP Location: Left Arm, Patient Position: Sitting, Cuff Size: Large)   Pulse (!) 106   Temp 97.9 F (36.6 C) (Temporal)   Resp 15   Ht 6\' 4"  (1.93 m)   Wt (!) 315 lb 6.4 oz (143.1 kg)   SpO2 97%   BMI 38.39 kg/m   BP Readings from Last 3 Encounters:  08/31/19 140/76  12/09/18 128/88  10/27/17 118/84    Wt Readings from Last 3 Encounters:    08/31/19 (!) 315 lb 6.4 oz (143.1 kg)  07/25/19 300 lb (136.1 kg)  12/09/18 (!) 306 lb 12.8 oz (139.2 kg)    General appearance: alert, cooperative and appears stated age Ears: normal TM's and external ear canals both ears Throat: lips, mucosa, and tongue normal; teeth and gums normal Neck: no adenopathy, no carotid bruit, supple, symmetrical, trachea midline and thyroid not enlarged, symmetric, no tenderness/mass/nodules Back: symmetric, no curvature. ROM normal. No CVA tenderness. Lungs: clear to auscultation bilaterally Heart: regular rate and rhythm, S1, S2 normal, no murmur, click, rub or gallop Abdomen: soft, non-tender; bowel sounds normal; no masses,  no organomegaly Pulses: 2+ and symmetric Skin: Skin color, texture, turgor normal. No rashes or lesions Lymph nodes: Cervical, supraclavicular, and axillary nodes normal.  Lab Results  Component Value Date   HGBA1C 5.2 07/01/2017   HGBA1C 5.1 03/31/2016   HGBA1C 4.9 09/24/2012    Lab Results  Component Value Date   CREATININE 0.86 10/27/2017   CREATININE 0.82 07/01/2017   CREATININE 0.92 12/29/2016    Lab Results  Component Value Date   WBC 8.0 10/27/2017   HGB 12.1 (L) 10/27/2017   HCT 35.9 (L) 10/27/2017   PLT 207.0 10/27/2017   GLUCOSE 116 (H) 10/27/2017   CHOL 208 (H) 07/01/2017   TRIG 94.0 07/01/2017   HDL 66.50 07/01/2017   LDLCALC 123 (H) 07/01/2017   ALT 14 10/27/2017   AST 24 10/27/2017   NA 138 10/27/2017   K 4.2 10/27/2017   CL 102 10/27/2017   CREATININE 0.86 10/27/2017   BUN 15 10/27/2017   CO2 28 10/27/2017   TSH 2.88 12/29/2016   PSA 0.54 03/31/2016   HGBA1C 5.2 07/01/2017    CT RENAL STONE STUDY  Result Date: 10/27/2017 CLINICAL DATA:  44 year old male with 1 week of right flank pain and 3 days of hematuria. Prior hernia repair and gastric bypass. EXAM: CT ABDOMEN AND PELVIS WITHOUT CONTRAST TECHNIQUE: Multidetector CT imaging of the abdomen and pelvis was performed following the standard  protocol without IV contrast. COMPARISON:  CT Abdomen and Pelvis 01/17/2014 FINDINGS: Lower chest: Stable and negative lung bases. No pericardial or pleural effusion. Hepatobiliary: Negative noncontrast liver and gallbladder. Pancreas: Negative. Spleen: Negative. Adrenals/Urinary Tract: Stable mild adrenal gland thickening such as due to adrenal hyperplasia. Negative noncontrast left kidney. The proximal left ureter appears negative. The distal course of both ureters is difficult to delineate. There is no definite right hydronephrosis or perinephric stranding. The proximal right ureter appears within normal limits, no periureteral stranding. No urologic calculus is identified. The urinary bladder is largely decompressed and negative. Stomach/Bowel: Mild to moderate retained stool in the rectum with decompressed distal sigmoid colon. However, the sigmoid is redundant tracking into the left upper quadrant but negative aside from mild to moderate retained stool which continues into the descending colon. Redundant transverse colon with similar retained stool. Mildly redundant right colon  with similar retained stool. Normal appendix (series 2, image 50. No large bowel inflammation. Negative terminal ileum. There is some flocculated material in the distal small bowel. Sequelae of gastric bypass with mildly increased fluid in the bypassed portion of the stomach. Still, the bypassed portion is fairly decompressed. Negative duodenum. Roux-en-Y type mid epigastric small bowel anastomosis is mildly patulous but appears stable. No dilated small bowel. No definite mesenteric inflammation. No abdominal free air or free fluid. Vascular/Lymphatic: Vascular patency is not evaluated in the absence of IV contrast. Mild calcified iliac artery atherosclerosis. Abdominal and pelvic lymph nodes appear stable and within normal limits. Reproductive: Negative. Other: No pelvic free fluid. Musculoskeletal: Stable.  No acute osseous  abnormality identified. IMPRESSION: 1. No urologic calculus identified. No secondary signs of obstructive uropathy. 2. No acute or inflammatory process identified in the non-contrast abdomen or pelvis. 3. Normal appendix. No bowel obstruction. Prior Roux-en-Y type gastric bypass. Electronically Signed   By: Odessa Fleming M.D.   On: 10/27/2017 15:44    Assessment & Plan:   Problem List Items Addressed This Visit      Unprioritized   Obesity    He has gained a significant amount of weight in the last 1-2 years.  Trial of phentermine to suppress appetite.       Relevant Medications   phentermine (ADIPEX-P) 37.5 MG tablet   Concentration deficit    Continue wellbutrin given concurrent anhedonia, fatigue weight gain       Anemia - Primary   Relevant Orders   CBC with Differential/Platelet   Fatigue    Checking thyroid,  Sleep study,  Cbc . Continue wellbutrin       Relevant Orders   Ambulatory referral to Sleep Studies   Snoring    With gasping,  Weight gain,  Morning fatigue nad elevated blood pressure. sleep study needed.       Relevant Orders   Ambulatory referral to Sleep Studies    Other Visit Diagnoses    Obesity (BMI 30.0-34.9)       Relevant Orders   TSH   Lipid panel   Direct LDL   Comprehensive metabolic panel   Ambulatory referral to Sleep Studies   Screening for prostate cancer       Relevant Orders   PSA   Elevated blood-pressure reading, without diagnosis of hypertension       Relevant Orders   Microalbumin / creatinine urine ratio   Ambulatory referral to Sleep Studies      I have discontinued Randel Books "Dennard Nip Gulyas"'s sulfamethoxazole-trimethoprim and methylPREDNISolone. I am also having him start on phentermine. Additionally, I am having him maintain his vitamin B-12, calcium carbonate, Multiple Vitamin (MULTIVITAMINS PO), clobetasol, cholecalciferol, Multiple Vitamins-Minerals (ZINC PO), Probiotic Product (PROBIOTIC DAILY PO), EPINEPHrine,  hydrocortisone, tamsulosin, buPROPion, escitalopram, and ketoconazole.  Meds ordered this encounter  Medications  . phentermine (ADIPEX-P) 37.5 MG tablet    Sig: Take 1 tablet (37.5 mg total) by mouth daily before breakfast.    Dispense:  30 tablet    Refill:  0    Medications Discontinued During This Encounter  Medication Reason  . methylPREDNISolone (MEDROL DOSEPAK) 4 MG TBPK tablet Completed Course  . sulfamethoxazole-trimethoprim (BACTRIM DS,SEPTRA DS) 800-160 MG tablet Completed Course    Follow-up: No follow-ups on file.   Sherlene Shams, MD

## 2019-08-31 NOTE — Patient Instructions (Addendum)
1) reduce lexapro to every other day for one week,  Then stop  2) continue wellbutrin   3) Get your  BP ad pulse checked at work and send me the reading   4) we will add 1/2 tablet phentermine  At lunchtime once your BP is under 130/80 (this is to control your appetite)  5) I am ordering Sleep study for rule out sleep apnea   6) start  Walking  On a regular basis ( or return to gym)  6) RTC 6 weeks

## 2019-09-01 ENCOUNTER — Other Ambulatory Visit: Payer: Self-pay | Admitting: Internal Medicine

## 2019-09-01 DIAGNOSIS — E039 Hypothyroidism, unspecified: Secondary | ICD-10-CM

## 2019-09-01 MED ORDER — LEVOTHYROXINE SODIUM 50 MCG PO TABS: 50.0000 ug | ORAL_TABLET | Freq: Every day | ORAL | 3 refills | Status: DC

## 2019-09-05 ENCOUNTER — Encounter: Payer: Managed Care, Other (non HMO) | Admitting: Physical Therapy

## 2019-09-07 ENCOUNTER — Encounter: Payer: Managed Care, Other (non HMO) | Admitting: Physical Therapy

## 2019-09-12 ENCOUNTER — Encounter: Payer: Managed Care, Other (non HMO) | Admitting: Physical Therapy

## 2019-09-14 ENCOUNTER — Encounter: Payer: Managed Care, Other (non HMO) | Admitting: Physical Therapy

## 2019-09-19 ENCOUNTER — Encounter: Payer: Managed Care, Other (non HMO) | Admitting: Physical Therapy

## 2019-09-20 ENCOUNTER — Ambulatory Visit: Payer: Managed Care, Other (non HMO) | Attending: Neurology

## 2019-09-20 DIAGNOSIS — G4733 Obstructive sleep apnea (adult) (pediatric): Secondary | ICD-10-CM | POA: Diagnosis not present

## 2019-09-21 ENCOUNTER — Other Ambulatory Visit: Payer: Self-pay

## 2019-09-21 ENCOUNTER — Encounter: Payer: Managed Care, Other (non HMO) | Admitting: Physical Therapy

## 2019-09-24 ENCOUNTER — Other Ambulatory Visit: Payer: Self-pay | Admitting: Internal Medicine

## 2019-09-28 DIAGNOSIS — G4733 Obstructive sleep apnea (adult) (pediatric): Secondary | ICD-10-CM

## 2019-09-29 NOTE — Assessment & Plan Note (Signed)
confirmed with sleep study done at [facility] on September 21 2019 with AHI 14.8/hr,  27/hr during REM sleep.   Desats to 84% .noted.   Weight loss , alternative sleeping position,  Management of nasal congestion , and CPAP titration study recommended.

## 2019-10-05 ENCOUNTER — Ambulatory Visit: Payer: Managed Care, Other (non HMO) | Admitting: Internal Medicine

## 2019-10-06 ENCOUNTER — Telehealth: Payer: Self-pay

## 2019-10-06 DIAGNOSIS — Z1152 Encounter for screening for COVID-19: Secondary | ICD-10-CM

## 2019-10-06 NOTE — Telephone Encounter (Signed)
Pt is having a sleep study done on 10/12/2019 and needs to have a covid test done prior to procedure. Order has been placed. Printed and placed in quick sign folder for signature.

## 2019-10-11 ENCOUNTER — Telehealth: Payer: Self-pay | Admitting: Internal Medicine

## 2019-10-11 NOTE — Telephone Encounter (Signed)
I called [facility] at [facility] regarding pt sleep study and cpap.

## 2019-10-12 ENCOUNTER — Ambulatory Visit (INDEPENDENT_AMBULATORY_CARE_PROVIDER_SITE_OTHER): Payer: Managed Care, Other (non HMO) | Admitting: Internal Medicine

## 2019-10-12 ENCOUNTER — Encounter: Payer: Self-pay | Admitting: Internal Medicine

## 2019-10-12 ENCOUNTER — Other Ambulatory Visit: Payer: Self-pay

## 2019-10-12 VITALS — BP 136/84 | HR 73 | Temp 97.8°F | Resp 15 | Ht 76.0 in | Wt 318.6 lb

## 2019-10-12 DIAGNOSIS — E6609 Other obesity due to excess calories: Secondary | ICD-10-CM | POA: Diagnosis not present

## 2019-10-12 DIAGNOSIS — D519 Vitamin B12 deficiency anemia, unspecified: Secondary | ICD-10-CM

## 2019-10-12 DIAGNOSIS — E039 Hypothyroidism, unspecified: Secondary | ICD-10-CM | POA: Diagnosis not present

## 2019-10-12 DIAGNOSIS — N5089 Other specified disorders of the male genital organs: Secondary | ICD-10-CM | POA: Diagnosis not present

## 2019-10-12 DIAGNOSIS — Z6833 Body mass index (BMI) 33.0-33.9, adult: Secondary | ICD-10-CM

## 2019-10-12 DIAGNOSIS — G4733 Obstructive sleep apnea (adult) (pediatric): Secondary | ICD-10-CM

## 2019-10-12 DIAGNOSIS — D649 Anemia, unspecified: Secondary | ICD-10-CM

## 2019-10-12 LAB — TSH: TSH: 4.79 u[IU]/mL — ABNORMAL HIGH (ref 0.35–4.50)

## 2019-10-12 LAB — B12 AND FOLATE PANEL
Folate: 23.6 ng/mL (ref >5.9)
Vitamin B-12: 280 pg/mL (ref 211–911)

## 2019-10-12 MED ORDER — LEVOTHYROXINE SODIUM 75 MCG PO TABS: 75.0000 ug | ORAL_TABLET | Freq: Every day | ORAL | 0 refills | Status: DC

## 2019-10-12 NOTE — Assessment & Plan Note (Signed)
Patient's thyroid function is underactive on current levothyroxine dose of  50 mcg daily. Increasing dose to 75 mcg  Lab Results  Component Value Date   TSH 4.79 (H) 10/12/2019

## 2019-10-12 NOTE — Assessment & Plan Note (Addendum)
He has gained a significant amount of weight in the last 1-2 years after bariatric surgery after losing 150 lbs.  BMI is now Body mass index is 38.78 kg/m. .  Trial of phentermine to suppress appetite was discussed as well as alternative therapies. He was advise to start wit 1/2 tablet daily ,  And rtc one week for vital signs

## 2019-10-12 NOTE — Assessment & Plan Note (Signed)
Confirmed with recent sleep study.  CPAP titration study planned

## 2019-10-12 NOTE — Patient Instructions (Addendum)
1) Urology referral in progress to evaluate the lump in your left testicle  2) start the phentermine at 1/2 tablet daily in the morning.  And an afternoon dose if tolerated ,  And if needed to suppress evening appetite (take it by 2 pm to avoid insomnia)  3) Return in one week for BP check   4) Goal is 16 lbs in 3 months

## 2019-10-12 NOTE — Assessment & Plan Note (Signed)
Referring to urology for evaluation

## 2019-10-12 NOTE — Assessment & Plan Note (Signed)
Persistent.  checking iron,  b12 and folate   . Lab Results  Component Value Date   WBC 6.2 08/31/2019   HGB 12.4 (L) 08/31/2019   HCT 37.9 (L) 08/31/2019   MCV 96.6 08/31/2019   PLT 226.0 08/31/2019

## 2019-10-12 NOTE — Progress Notes (Signed)
Subjective:  Patient ID: Chase Carey, male    DOB: 02/28/1976  Age: 44 y.o. MRN: [id number]  CC: The primary encounter diagnosis was Testicular mass. Diagnoses of Hypothyroidism (acquired), Anemia, unspecified type, Class 1 obesity due to excess calories without serious comorbidity with body mass index (BMI) of 33.0 to 33.9 in adult, and OSA (obstructive sleep apnea) were also pertinent to this visit.  HPI   Chase Carey presents for  5 week follow up on obesity, anemia (chronic),  Fatigue and depression .  Underactive thyroid was diagnosed and treated ,   Sleep study was done and positive for mild to moderate OSA  Tolerating levothyroxine without side effects.  CPAP titration study is scheduled for next week Did not start phentermine due to fear of side effects.. Has gained 3 lbs   Walking 2 times daily   Labs reviewed,  Taking oral b12  IDA:  Reviewed prior colonoscopy,  normal in 2011 except for internal hemorrhoids    In the interim he has developed a lump in his left Testicle   left side, first noticed  2 days ago.    Outpatient Medications Prior to Visit  Medication Sig Dispense Refill  . buPROPion (WELLBUTRIN XL) 300 MG 24 hr tablet Take 1 tablet (300 mg total) by mouth daily. 90 tablet 0  . calcium carbonate (OS-CAL) 600 MG TABS Take 600 mg by mouth 2 (two) times daily with a meal.    . cholecalciferol (VITAMIN D) 1000 UNITS tablet Take 3,000 Units by mouth daily.     . clobetasol (OLUX) 0.05 % topical foam Apply topically 2 (two) times daily.    Marland Kitchen EPINEPHrine 0.3 mg/0.3 mL IJ SOAJ injection INJECT 0.3 MLS (0.3 MG TOTAL) INTO THE MUSCLE ONCE FOR 1 DOSE.    . hydrocortisone (ANUSOL-HC) 25 MG suppository Place 1 suppository (25 mg total) rectally 2 (two) times daily. 12 suppository 5  . ketoconazole (NIZORAL) 2 % shampoo SHAMPOO FACE, SCALP, AND EARS TOPICALLY TWICE A WEEK. LET LATHER SIT FOR 2 3 MINS. BEFORE RINSING.    Marland Kitchen levothyroxine (SYNTHROID) 50 MCG tablet Take 1  tablet (50 mcg total) by mouth daily. 90 tablet 3  . Multiple Vitamin (MULTIVITAMINS PO) Take by mouth daily.    . Multiple Vitamins-Minerals (ZINC PO) Take 1 tablet by mouth daily.    . Probiotic Product (PROBIOTIC DAILY PO) Take by mouth daily.    . tamsulosin (FLOMAX) 0.4 MG CAPS capsule TAKE 2 CAPSULES BY MOUTH EVERY DAY 60 capsule 5  . vitamin B-12 (CYANOCOBALAMIN) 100 MCG tablet Take 100 mcg by mouth daily.    . phentermine (ADIPEX-P) 37.5 MG tablet Take 1 tablet (37.5 mg total) by mouth daily before breakfast. (Patient not taking: Reported on 10/12/2019) 30 tablet 0  . escitalopram (LEXAPRO) 10 MG tablet Take 1 tablet (10 mg total) by mouth daily. (Patient not taking: Reported on 10/12/2019) 90 tablet 0   No facility-administered medications prior to visit.    Review of Systems;  Patient denies headache, fevers, malaise, unintentional weight loss, skin rash, eye pain, sinus congestion and sinus pain, sore throat, dysphagia,  hemoptysis , cough, dyspnea, wheezing, chest pain, palpitations, orthopnea, edema, abdominal pain, nausea, melena, diarrhea, constipation, flank pain, dysuria, hematuria, urinary  Frequency, nocturia, numbness, tingling, seizures,  Focal weakness, Loss of consciousness,  Tremor, insomnia, depression, anxiety, and suicidal ideation.      Objective:  BP 136/84 (BP Location: Left Arm, Patient Position: Sitting, Cuff Size: Large)   Pulse  73   Temp 97.8 F (36.6 C) (Temporal)   Resp 15   Ht 6\' 4"  (1.93 m)   Wt (!) 318 lb 9.6 oz (144.5 kg)   SpO2 99%   BMI 38.78 kg/m   BP Readings from Last 3 Encounters:  10/12/19 136/84  08/31/19 140/76  12/09/18 128/88    Wt Readings from Last 3 Encounters:  10/12/19 (!) 318 lb 9.6 oz (144.5 kg)  08/31/19 (!) 315 lb 6.4 oz (143.1 kg)  07/25/19 300 lb (136.1 kg)    General appearance: alert, cooperative and appears stated age Ears: normal TM's and external ear canals both ears Throat: lips, mucosa, and tongue normal;  teeth and gums normal Neck: no adenopathy, no carotid bruit, supple, symmetrical, trachea midline and thyroid not enlarged, symmetric, no tenderness/mass/nodules Back: symmetric, no curvature. ROM normal. No CVA tenderness. Lungs: clear to auscultation bilaterally Heart: regular rate and rhythm, S1, S2 normal, no murmur, click, rub or gallop Abdomen: soft, non-tender; bowel sounds normal; no masses,  no organomegaly Pulses: 2+ and symmetric Inguinal:  Left testicular mass, anterior side  Skin: Skin color, texture, turgor normal. No rashes or lesions Lymph nodes: Cervical, supraclavicular, and axillary nodes normal.  Lab Results  Component Value Date   HGBA1C 5.2 07/01/2017   HGBA1C 5.1 03/31/2016   HGBA1C 4.9 09/24/2012    Lab Results  Component Value Date   CREATININE 0.81 08/31/2019   CREATININE 0.86 10/27/2017   CREATININE 0.82 07/01/2017    Lab Results  Component Value Date   WBC 6.2 08/31/2019   HGB 12.4 (L) 08/31/2019   HCT 37.9 (L) 08/31/2019   PLT 226.0 08/31/2019   GLUCOSE 77 08/31/2019   CHOL 203 (H) 08/31/2019   TRIG 112.0 08/31/2019   HDL 62.70 08/31/2019   LDLDIRECT 117.0 08/31/2019   LDLCALC 118 (H) 08/31/2019   ALT 15 08/31/2019   AST 27 08/31/2019   NA 137 08/31/2019   K 3.9 08/31/2019   CL 102 08/31/2019   CREATININE 0.81 08/31/2019   BUN 14 08/31/2019   CO2 30 08/31/2019   TSH 4.79 (H) 10/12/2019   PSA 0.44 08/31/2019   HGBA1C 5.2 07/01/2017   MICROALBUR <0.7 08/31/2019    CT RENAL STONE STUDY  Result Date: 10/27/2017 CLINICAL DATA:  44 year old male with 1 week of right flank pain and 3 days of hematuria. Prior hernia repair and gastric bypass. EXAM: CT ABDOMEN AND PELVIS WITHOUT CONTRAST TECHNIQUE: Multidetector CT imaging of the abdomen and pelvis was performed following the standard protocol without IV contrast. COMPARISON:  CT Abdomen and Pelvis 01/17/2014 FINDINGS: Lower chest: Stable and negative lung bases. No pericardial or pleural  effusion. Hepatobiliary: Negative noncontrast liver and gallbladder. Pancreas: Negative. Spleen: Negative. Adrenals/Urinary Tract: Stable mild adrenal gland thickening such as due to adrenal hyperplasia. Negative noncontrast left kidney. The proximal left ureter appears negative. The distal course of both ureters is difficult to delineate. There is no definite right hydronephrosis or perinephric stranding. The proximal right ureter appears within normal limits, no periureteral stranding. No urologic calculus is identified. The urinary bladder is largely decompressed and negative. Stomach/Bowel: Mild to moderate retained stool in the rectum with decompressed distal sigmoid colon. However, the sigmoid is redundant tracking into the left upper quadrant but negative aside from mild to moderate retained stool which continues into the descending colon. Redundant transverse colon with similar retained stool. Mildly redundant right colon with similar retained stool. Normal appendix (series 2, image 50. No large bowel inflammation. Negative terminal ileum.  There is some flocculated material in the distal small bowel. Sequelae of gastric bypass with mildly increased fluid in the bypassed portion of the stomach. Still, the bypassed portion is fairly decompressed. Negative duodenum. Roux-en-Y type mid epigastric small bowel anastomosis is mildly patulous but appears stable. No dilated small bowel. No definite mesenteric inflammation. No abdominal free air or free fluid. Vascular/Lymphatic: Vascular patency is not evaluated in the absence of IV contrast. Mild calcified iliac artery atherosclerosis. Abdominal and pelvic lymph nodes appear stable and within normal limits. Reproductive: Negative. Other: No pelvic free fluid. Musculoskeletal: Stable.  No acute osseous abnormality identified. IMPRESSION: 1. No urologic calculus identified. No secondary signs of obstructive uropathy. 2. No acute or inflammatory process identified in  the non-contrast abdomen or pelvis. 3. Normal appendix. No bowel obstruction. Prior Roux-en-Y type gastric bypass. Electronically Signed   By: Odessa Fleming M.D.   On: 10/27/2017 15:44    Assessment & Plan:   Problem List Items Addressed This Visit      Unprioritized   Obesity    He has gained a significant amount of weight in the last 1-2 years after bariatric surgery after losing 150 lbs.  BMI is now Body mass index is 38.78 kg/m. .  Trial of phentermine to suppress appetite was discussed as well as alternative therapies. He was advise to start wit 1/2 tablet daily ,  And rtc one week for vital signs      Anemia    Persistent.  checking iron,  b12 and folate   . Lab Results  Component Value Date   WBC 6.2 08/31/2019   HGB 12.4 (L) 08/31/2019   HCT 37.9 (L) 08/31/2019   MCV 96.6 08/31/2019   PLT 226.0 08/31/2019         Relevant Orders   B12 and Folate Panel (Completed)   Iron, TIBC and Ferritin Panel   Fecal occult blood, imunochemical   OSA (obstructive sleep apnea)    Confirmed with recent sleep study.  CPAP titration study planned       Hypothyroidism (acquired)    Patient's thyroid function is underactive on current levothyroxine dose of  50 mcg daily. Increasing dose to 75 mcg  Lab Results  Component Value Date   TSH 4.79 (H) 10/12/2019         Relevant Orders   TSH (Completed)   Testicular mass - Primary    Referring to urology for evaluation       Relevant Orders   Ambulatory referral to Urology      I have discontinued Wallene Huh Aguila "Dennard Nip Platts"'s escitalopram. I am also having him maintain his vitamin B-12, calcium carbonate, Multiple Vitamin (MULTIVITAMINS PO), clobetasol, cholecalciferol, Multiple Vitamins-Minerals (ZINC PO), Probiotic Product (PROBIOTIC DAILY PO), hydrocortisone, buPROPion, ketoconazole, phentermine, levothyroxine, tamsulosin, and EPINEPHrine.  No orders of the defined types were placed in this encounter.   Medications  Discontinued During This Encounter  Medication Reason  . escitalopram (LEXAPRO) 10 MG tablet Discontinued by provider    Follow-up: Return in about 1 week (around 10/19/2019).   Sherlene Shams, MD

## 2019-10-13 ENCOUNTER — Other Ambulatory Visit: Payer: Self-pay | Admitting: Internal Medicine

## 2019-10-13 DIAGNOSIS — D509 Iron deficiency anemia, unspecified: Secondary | ICD-10-CM

## 2019-10-13 LAB — IRON,TIBC AND FERRITIN PANEL
%SAT: 18 % (calc) — ABNORMAL LOW (ref 20–48)
Ferritin: 16 ng/mL — ABNORMAL LOW (ref 38–380)
Iron: 83 ug/dL (ref 50–180)
TIBC: 456 mcg/dL (calc) — ABNORMAL HIGH (ref 250–425)

## 2019-10-19 ENCOUNTER — Other Ambulatory Visit
Admission: RE | Admit: 2019-10-19 | Discharge: 2019-10-19 | Disposition: A | Discharge status: Home / Self Care | Payer: Managed Care, Other (non HMO) | Source: Ambulatory Visit | Attending: Internal Medicine | Admitting: Internal Medicine

## 2019-10-19 DIAGNOSIS — Z01812 Encounter for preprocedural laboratory examination: Secondary | ICD-10-CM | POA: Insufficient documentation

## 2019-10-19 DIAGNOSIS — Z20822 Contact with and (suspected) exposure to covid-19: Secondary | ICD-10-CM | POA: Diagnosis not present

## 2019-10-19 LAB — SARS CORONAVIRUS 2 (TAT 6-24 HRS): SARS Coronavirus 2: NEGATIVE

## 2019-10-20 ENCOUNTER — Ambulatory Visit: Payer: Managed Care, Other (non HMO)

## 2019-10-20 NOTE — Progress Notes (Signed)
10/21/19 9:30 AM   Chase Carey 11/29/1975 [id number]  Referring provider: Crecencio Mc, MD [place],   [address] Chief Complaint  Patient presents with  . Testicular Mass    New Patient    HPI: Chase Carey is a 44 y.o. M who presents today for the evaluation and management of testicular mass.   His most recent PSA 0.44 as of 08/31/19. CT renal stone study in 10/2017 was unremarkable.   Visted PCP on 10/12/19 c/o of lump on his left testicle first noticed 2 days prior to visit.   Today, he reports of pain upon palpation of left testicle. He also states of weak/split stream which has been going on for a while.   He was previously seen by Dr. Bernardo Heater at [facility] in the past.   He denies gross hematuria or dysuria.   He has a SHx of vasectomy.  PMH: Past Medical History:  Diagnosis Date  . Anal fissure   . Anemia, unspecified   . Arthritis    ankle  . Asymptomatic varicose veins   . Balanoposthitis   . Cervicalgia   . Chronic headaches   . Elevated blood pressure reading without diagnosis of hypertension   . Gallstones   . GERD (gastroesophageal reflux disease)   . Localized superficial swelling, mass, or lump   . Lumbago   . Obesity, unspecified   . Other abnormal blood chemistry   . Other dyspnea and respiratory abnormality   . Plantar fascial fibromatosis   . Psychogenic pain, site unspecified   . Seborrhea capitis   . Sleep apnea    mild  . Sprain of neck   . Tuberculosis    positive PPD; negative chest x ray- 2000, took medication for 6 months    Surgical History: Past Surgical History:  Procedure Laterality Date  . GASTRIC BYPASS  10/17/2009  . [place]  2011  . HERNIA REPAIR  03/2016   internal at [facility]  . RLE varicose vein laser procedure  2011   [facility]  . VASECTOMY  02/25/2013   no report of complications  . VEIN LIGATION AND STRIPPING Right 04/07/13   leg    Home Medications:    Allergies as of 10/21/2019      Reactions   Shrimp [shellfish Allergy] Anaphylaxis   Shortness of breath. Shortness of breath.      Medication List       Accurate as of October 21, 2019 11:59 PM. If you have any questions, ask your nurse or doctor.        buPROPion 300 MG 24 hr tablet Commonly known as: WELLBUTRIN XL Take 1 tablet (300 mg total) by mouth daily.   calcium carbonate 600 MG Tabs tablet Commonly known as: OS-CAL Take 600 mg by mouth 2 (two) times daily with a meal.   cholecalciferol 1000 units tablet Commonly known as: VITAMIN D Take 3,000 Units by mouth daily.   clobetasol 0.05 % topical foam Commonly known as: OLUX Apply topically 2 (two) times daily.   EPINEPHrine 0.3 mg/0.3 mL Soaj injection Commonly known as: EPI-PEN INJECT 0.3 MLS (0.3 MG TOTAL) INTO THE MUSCLE ONCE FOR 1 DOSE.   hydrocortisone 25 MG suppository Commonly known as: ANUSOL-HC Place 1 suppository (25 mg total) rectally 2 (two) times daily.   ketoconazole 2 % shampoo Commonly known as: NIZORAL SHAMPOO FACE, SCALP, AND EARS TOPICALLY TWICE A WEEK. LET LATHER SIT FOR 2 3 MINS. BEFORE  RINSING.   levothyroxine 75 MCG tablet Commonly known as: SYNTHROID Take 1 tablet (75 mcg total) by mouth daily.   MULTIVITAMINS PO Take by mouth daily.   phentermine 37.5 MG tablet Commonly known as: ADIPEX-P Take 1 tablet (37.5 mg total) by mouth daily before breakfast.   PROBIOTIC DAILY PO Take by mouth daily.   tamsulosin 0.4 MG Caps capsule Commonly known as: FLOMAX TAKE 2 CAPSULES BY MOUTH EVERY DAY   vitamin B-12 100 MCG tablet Commonly known as: CYANOCOBALAMIN Take 100 mcg by mouth daily.   ZINC PO Take 1 tablet by mouth daily.       Allergies:  Allergies  Allergen Reactions  . Shrimp [Shellfish Allergy] Anaphylaxis    Shortness of breath. Shortness of breath.    Family History: Family History  Adopted: Yes  Problem Relation Age of Onset  . Other Other        adopted     Social History:  reports that he has never smoked. He has never used smokeless tobacco. He reports that he does not drink alcohol or use drugs.   Physical Exam: BP 133/74   Pulse 88   Ht 6\' 4"  (1.93 m)   Wt 299 lb (135.6 kg)   BMI 36.40 kg/m   Constitutional:  Alert and oriented, No acute distress. HEENT: [place] AT, moist mucus membranes.  Trachea midline, no masses. Cardiovascular: No clubbing, cyanosis, or edema. Respiratory: Normal respiratory effort, no increased work of breathing. GU: No CVA tenderness. Uncircumcised. Bilaterally descended benign testicles. Rubbery pea sized lesion posterior to left testicle behind cord noted, possibly disjointed epididymis vs granuloma. No other scrotal pathology appreciated.  Skin: No rashes, bruises or suspicious lesions. Neurologic: Grossly intact, no focal deficits, moving all 4 extremities. Psychiatric: Normal mood and affect.  Laboratory Data:  Lab Results  Component Value Date   CREATININE 0.81 08/31/2019    Lab Results  Component Value Date   PSA 0.44 08/31/2019   PSA 0.54 03/31/2016   PSA 0.41 02/28/2015   Assessment & Plan:    1. Testicular pain, left  DDx includes sperm granuloma s/p vasectomy vs. disjointed epididymis Unlikely pathologic Likely not related to cancer or infection  No need for imaging at that time Return in 3 months for recheck to ensure stability   [facility] [address] [place], [place] [phone]  I, [phone], am acting as a scribe for Dr. Donne Hazel,  I have reviewed the above documentation for accuracy and completeness, and I agree with the above.   Vanna Scotland, MD

## 2019-10-21 ENCOUNTER — Encounter: Payer: Self-pay | Admitting: Urology

## 2019-10-21 ENCOUNTER — Ambulatory Visit: Payer: Managed Care, Other (non HMO) | Admitting: Urology

## 2019-10-21 ENCOUNTER — Other Ambulatory Visit: Payer: Self-pay

## 2019-10-21 VITALS — BP 133/74 | HR 88 | Ht 76.0 in | Wt 299.0 lb

## 2019-10-21 DIAGNOSIS — N5089 Other specified disorders of the male genital organs: Secondary | ICD-10-CM | POA: Diagnosis not present

## 2019-10-24 ENCOUNTER — Other Ambulatory Visit: Payer: Self-pay | Admitting: Internal Medicine

## 2019-10-24 DIAGNOSIS — G4733 Obstructive sleep apnea (adult) (pediatric): Secondary | ICD-10-CM

## 2019-10-26 ENCOUNTER — Ambulatory Visit: Payer: Managed Care, Other (non HMO)

## 2019-11-09 ENCOUNTER — Other Ambulatory Visit: Payer: Self-pay | Admitting: Internal Medicine

## 2019-11-09 NOTE — Telephone Encounter (Signed)
Refill request for phentermine, last seen 10-12-19, last filled 08-31-19.  Please advise.

## 2019-11-09 NOTE — Telephone Encounter (Signed)
phentermine has been refilled  Please schedule a follow up appointment in July

## 2019-11-25 ENCOUNTER — Other Ambulatory Visit: Payer: Self-pay | Admitting: Internal Medicine

## 2019-11-25 DIAGNOSIS — F411 Generalized anxiety disorder: Secondary | ICD-10-CM

## 2019-12-16 NOTE — Telephone Encounter (Signed)
Pt is following up on his request?  

## 2019-12-18 MED ORDER — PHENTERMINE HCL 37.5 MG PO TABS: ORAL_TABLET | ORAL | 0 refills | Status: DC

## 2019-12-28 ENCOUNTER — Other Ambulatory Visit (INDEPENDENT_AMBULATORY_CARE_PROVIDER_SITE_OTHER): Payer: Managed Care, Other (non HMO)

## 2019-12-28 DIAGNOSIS — D509 Iron deficiency anemia, unspecified: Secondary | ICD-10-CM | POA: Diagnosis not present

## 2019-12-28 DIAGNOSIS — R195 Other fecal abnormalities: Secondary | ICD-10-CM

## 2019-12-28 DIAGNOSIS — D649 Anemia, unspecified: Secondary | ICD-10-CM

## 2019-12-28 LAB — FECAL OCCULT BLOOD, IMMUNOCHEMICAL: Fecal Occult Bld: POSITIVE — AB

## 2019-12-28 NOTE — Progress Notes (Signed)
His fecal occult blood test was abnormal.  I am recommending that he see a gastroenterologist.  Does  he have a preference?

## 2020-01-09 ENCOUNTER — Other Ambulatory Visit: Payer: Self-pay | Admitting: Internal Medicine

## 2020-01-16 ENCOUNTER — Ambulatory Visit: Payer: Managed Care, Other (non HMO) | Admitting: Internal Medicine

## 2020-01-16 NOTE — Progress Notes (Signed)
01/17/2020 1:06 PM   Chase Carey July 02, 1975 [id number]  Referring provider: Sherlene Shams, MD [address] [place],  [place] [address] Chief Complaint  Patient presents with  . Follow-up    HPI: Chase Carey is a 44 y.o. male with a scrotal mass who is seen today for a 3 month follow up.  He was previously seen by Dr. Lonna Cobb at [facility] in the past.   His most recent PSA 0.44 as of 08/31/19. CT renal stone study in 10/2017 was unremarkable.   He has a SHx of vasectomy.  Today he denies any scrotal pain. He has not noticed any changes since last visit.    PMH: Past Medical History:  Diagnosis Date  . Anal fissure   . Anemia, unspecified   . Arthritis    ankle  . Asymptomatic varicose veins   . Balanoposthitis   . Cervicalgia   . Chronic headaches   . Elevated blood pressure reading without diagnosis of hypertension   . Gallstones   . GERD (gastroesophageal reflux disease)   . Localized superficial swelling, mass, or lump   . Lumbago   . Obesity, unspecified   . Other abnormal blood chemistry   . Other dyspnea and respiratory abnormality   . Plantar fascial fibromatosis   . Psychogenic pain, site unspecified   . Seborrhea capitis   . Sleep apnea    mild  . Sprain of neck   . Tuberculosis    positive PPD; negative chest x ray- 2000, took medication for 6 months    Surgical History: Past Surgical History:  Procedure Laterality Date  . GASTRIC BYPASS  10/17/2009  . HEMORRHOIDECTOMY WITH HEMORRHOID BANDING  2011  . HERNIA REPAIR  03/2016   internal at [facility]  . RLE varicose vein laser procedure  2011   [facility]  . VASECTOMY  02/25/2013   no report of complications  . VEIN LIGATION AND STRIPPING Right 04/07/13   leg    Home Medications:  Allergies as of 01/17/2020      Reactions   Shrimp [shellfish Allergy] Anaphylaxis   Shortness of breath. Shortness of breath.      Medication List       Accurate as of January 17, 2020  1:06 PM. If you  have any questions, ask your nurse or doctor.        STOP taking these medications   clobetasol 0.05 % topical foam Commonly known as: OLUX Stopped by: Vanna Scotland, MD   phentermine 37.5 MG tablet Commonly known as: ADIPEX-P Stopped by: Vanna Scotland, MD     TAKE these medications   buPROPion 300 MG 24 hr tablet Commonly known as: WELLBUTRIN XL TAKE 1 TABLET BY MOUTH EVERY DAY   calcium carbonate 600 MG Tabs tablet Commonly known as: OS-CAL Take 600 mg by mouth 2 (two) times daily with a meal.   cholecalciferol 1000 units tablet Commonly known as: VITAMIN D Take 3,000 Units by mouth daily.   EPINEPHrine 0.3 mg/0.3 mL Soaj injection Commonly known as: EPI-PEN INJECT 0.3 MLS (0.3 MG TOTAL) INTO THE MUSCLE ONCE FOR 1 DOSE.   hydrocortisone 25 MG suppository Commonly known as: ANUSOL-HC Place 1 suppository (25 mg total) rectally 2 (two) times daily.   ketoconazole 2 % shampoo Commonly known as: NIZORAL SHAMPOO FACE, SCALP, AND EARS TOPICALLY TWICE A WEEK. LET LATHER SIT FOR 2 3 MINS. BEFORE RINSING.   levothyroxine 75 MCG tablet Commonly known as: SYNTHROID TAKE 1 TABLET BY  MOUTH EVERY DAY   MULTIVITAMINS PO Take by mouth daily.   PROBIOTIC DAILY PO Take by mouth daily.   tamsulosin 0.4 MG Caps capsule Commonly known as: FLOMAX TAKE 2 CAPSULES BY MOUTH EVERY DAY   vitamin B-12 100 MCG tablet Commonly known as: CYANOCOBALAMIN Take 100 mcg by mouth daily.   ZINC PO Take 1 tablet by mouth daily.       Allergies:  Allergies  Allergen Reactions  . Shrimp [Shellfish Allergy] Anaphylaxis    Shortness of breath. Shortness of breath.    Family History: Family History  Adopted: Yes  Problem Relation Age of Onset  . Other Other        adopted    Social History:  reports that he has never smoked. He has never used smokeless tobacco. He reports that he does not drink alcohol and does not use drugs.   Physical Exam: BP 126/82   Pulse (!) 112   Ht  6\' 4"  (1.93 m)   Wt (!) 290 lb (131.5 kg)   BMI 35.30 kg/m   Constitutional:  Alert and oriented, No acute distress. HEENT: [place] AT, moist mucus membranes.  Trachea midline, no masses. Cardiovascular: No clubbing, cyanosis, or edema. Respiratory: Normal respiratory effort, no increased work of breathing. GI: Abdomen is soft, nontender, nondistended, no abdominal masses GU: No CVA tenderness. Uncircumcised. Bilaterally descended benign testicles. Rubbery pea sized lesion posterior to left testicle behind cord noted, possibly disjointed epididymis vs granuloma. No other scrotal pathology appreciated. Stable from previous exam Skin: No rashes, bruises or suspicious lesions. Neurologic: Grossly intact, no focal deficits, moving all 4 extremities. Psychiatric: Normal mood and affect.  Laboratory Data:  Lab Results  Component Value Date   CREATININE 0.81 08/31/2019     Assessment & Plan:    1. Testicular pain/left scrotal mass Unlikely pathologic, stable Likely not related to cancer or infection  On exam there was a rubbery pea size lesion on the left cord felt to be benign to patholgy and needing no further follow up.  Follow up as needed.   [facility] [address], [address] [place], [place] [phone]  I, [phone], am acting as a scribe for Dr. Theador Hawthorne.  I have reviewed the above documentation for accuracy and completeness, and I agree with the above.   Vanna Scotland, MD

## 2020-01-17 ENCOUNTER — Other Ambulatory Visit: Payer: Self-pay

## 2020-01-17 ENCOUNTER — Ambulatory Visit (INDEPENDENT_AMBULATORY_CARE_PROVIDER_SITE_OTHER): Payer: Managed Care, Other (non HMO) | Admitting: Urology

## 2020-01-17 VITALS — BP 126/82 | HR 112 | Ht 76.0 in | Wt 290.0 lb

## 2020-01-17 DIAGNOSIS — N5089 Other specified disorders of the male genital organs: Secondary | ICD-10-CM | POA: Diagnosis not present

## 2020-01-18 ENCOUNTER — Ambulatory Visit: Payer: Managed Care, Other (non HMO) | Admitting: Internal Medicine

## 2020-01-24 ENCOUNTER — Telehealth: Payer: Self-pay | Admitting: Internal Medicine

## 2020-01-24 NOTE — Telephone Encounter (Signed)
Patient says he was at work today and he just remembers going down but not hitting the floor he was advised he was out a minute to 2 minutes BP was taken after he awoke was recorded at 127/89 Pulse he said in low 100"s he says he has been feeling dizzy for awhile when he stands up but not to this point. Advised patient he needs to be evaluated today and that we have no appointments, that he should go to urgent care and be evaluated , patient agreed he is going [facility] across from office.    Then advised patient to call and make follow up with PCP . Patient agreed with plan and will have someone drive him.

## 2020-01-24 NOTE — Telephone Encounter (Signed)
Pt called and said that he was at work at passed out

## 2020-02-03 ENCOUNTER — Ambulatory Visit: Payer: Managed Care, Other (non HMO) | Admitting: Internal Medicine

## 2020-02-03 ENCOUNTER — Encounter: Payer: Self-pay | Admitting: Internal Medicine

## 2020-02-03 ENCOUNTER — Other Ambulatory Visit: Payer: Self-pay | Admitting: Internal Medicine

## 2020-02-03 ENCOUNTER — Encounter: Payer: Self-pay | Admitting: Urology

## 2020-02-03 ENCOUNTER — Other Ambulatory Visit: Payer: Self-pay

## 2020-02-03 VITALS — BP 108/78 | HR 108 | Temp 98.0°F | Resp 16 | Ht 76.0 in | Wt 283.6 lb

## 2020-02-03 DIAGNOSIS — I9589 Other hypotension: Secondary | ICD-10-CM | POA: Diagnosis not present

## 2020-02-03 DIAGNOSIS — E861 Hypovolemia: Secondary | ICD-10-CM | POA: Diagnosis not present

## 2020-02-03 DIAGNOSIS — E663 Overweight: Secondary | ICD-10-CM | POA: Diagnosis not present

## 2020-02-03 DIAGNOSIS — I951 Orthostatic hypotension: Secondary | ICD-10-CM | POA: Diagnosis not present

## 2020-02-03 LAB — CBC WITH DIFFERENTIAL/PLATELET
Absolute Monocytes: 650 cells/uL (ref 200–950)
Basophils Absolute: 40 cells/uL (ref 0–200)
Basophils Relative: 0.6 %
Eosinophils Absolute: 60 cells/uL (ref 15–500)
Eosinophils Relative: 0.9 %
HCT: 37.4 % — ABNORMAL LOW (ref 38.5–50.0)
Hemoglobin: 12.4 g/dL — ABNORMAL LOW (ref 13.2–17.1)
Lymphs Abs: 1829 cells/uL (ref 850–3900)
MCH: 31.2 pg (ref 27.0–33.0)
MCHC: 33.2 g/dL (ref 32.0–36.0)
MCV: 94.2 fL (ref 80.0–100.0)
MPV: 9.9 fL (ref 7.5–12.5)
Monocytes Relative: 9.7 %
Neutro Abs: 4121 cells/uL (ref 1500–7800)
Neutrophils Relative %: 61.5 %
Platelets: 247 10*3/uL (ref 140–400)
RBC: 3.97 10*6/uL — ABNORMAL LOW (ref 4.20–5.80)
RDW: 12.2 % (ref 11.0–15.0)
Total Lymphocyte: 27.3 %
WBC: 6.7 10*3/uL (ref 3.8–10.8)

## 2020-02-03 MED ORDER — METOPROLOL SUCCINATE ER 25 MG PO TB24: 25.0000 mg | ORAL_TABLET | Freq: Every day | ORAL | 3 refills | Status: DC

## 2020-02-03 NOTE — Patient Instructions (Addendum)
Your blood pressure is dropping almost 30 pts when you stand up  I want you to increase the sodium in your diet.  The easiest low cal way to do this is to drink one shot glass of dill pickle juice daily   I am also prescribing 25 mg of metoprolol succinate at bedtime.  This is a beta blocker that will help normalize your heart rate and keep your blood pressure from changing so drastically   Continue phentermine;  Continue goal 100 ounces of water daily  Keep the cardiology appointment    Orthostatic Hypotension Blood pressure is a measurement of how strongly, or weakly, your blood is pressing against the walls of your arteries. Orthostatic hypotension is a sudden drop in blood pressure that happens when you quickly change positions, such as when you get up from sitting or lying down. Arteries are blood vessels that carry blood from your heart throughout your body. When blood pressure is too low, you may not get enough blood to your brain or to the rest of your organs. This can cause weakness, light-headedness, rapid heartbeat, and fainting. This can last for just a few seconds or for up to a few minutes. Orthostatic hypotension is usually not a serious problem. However, if it happens frequently or gets worse, it may be a sign of something more serious. What are the causes? This condition may be caused by:  Sudden changes in posture, such as standing up quickly after you have been sitting or lying down.  Blood loss.  Loss of body fluids (dehydration).  Heart problems.  Hormone (endocrine) problems.  Pregnancy.  Severe infection.  Lack of certain nutrients.  Severe allergic reactions (anaphylaxis).  Certain medicines, such as blood pressure medicine or medicines that make the body lose excess fluids (diuretics). Sometimes, this condition can be caused by not taking medicine as directed, such as taking too much of a certain medicine. What increases the risk? The following factors  may make you more likely to develop this condition:  Age. Risk increases as you get older.  Conditions that affect the heart or the central nervous system.  Taking certain medicines, such as blood pressure medicine or diuretics.  Being pregnant. What are the signs or symptoms? Symptoms of this condition may include:  Weakness.  Light-headedness.  Dizziness.  Blurred vision.  Fatigue.  Rapid heartbeat.  Fainting, in severe cases. How is this diagnosed? This condition is diagnosed based on:  Your medical history.  Your symptoms.  Your blood pressure measurement. Your health care provider will check your blood pressure when you are: ? Lying down. ? Sitting. ? Standing. A blood pressure reading is recorded as two numbers, such as "120 over 80" (or 120/80). The first ("top") number is called the systolic pressure. It is a measure of the pressure in your arteries as your heart beats. The second ("bottom") number is called the diastolic pressure. It is a measure of the pressure in your arteries when your heart relaxes between beats. Blood pressure is measured in a unit called mm Hg. Healthy blood pressure for most adults is 120/80. If your blood pressure is below 90/60, you may be diagnosed with hypotension. Other information or tests that may be used to diagnose orthostatic hypotension include:  Your other vital signs, such as your heart rate and temperature.  Blood tests.  Tilt table test. For this test, you will be safely secured to a table that moves you from a lying position to an upright position.  Your heart rhythm and blood pressure will be monitored during the test. How is this treated? This condition may be treated by:  Changing your diet. This may involve eating more salt (sodium) or drinking more water.  Taking medicines to raise your blood pressure.  Changing the dosage of certain medicines you are taking that might be lowering your blood pressure.  Wearing  compression stockings. These stockings help to prevent blood clots and reduce swelling in your legs. In some cases, you may need to go to the hospital for:  Fluid replacement. This means you will receive fluids through an IV.  Blood replacement. This means you will receive donated blood through an IV (transfusion).  Treating an infection or heart problems, if this applies.  Monitoring. You may need to be monitored while medicines that you are taking wear off. Follow these instructions at home: Eating and drinking   Drink enough fluid to keep your urine pale yellow.  Eat a healthy diet, and follow instructions from your health care provider about eating or drinking restrictions. A healthy diet includes: ? Fresh fruits and vegetables. ? Whole grains. ? Lean meats. ? Low-fat dairy products.  Eat extra salt only as directed. Do not add extra salt to your diet unless your health care provider told you to do that.  Eat frequent, small meals.  Avoid standing up suddenly after eating. Medicines  Take over-the-counter and prescription medicines only as told by your health care provider. ? Follow instructions from your health care provider about changing the dosage of your current medicines, if this applies. ? Do not stop or adjust any of your medicines on your own. General instructions   Wear compression stockings as told by your health care provider.  Get up slowly from lying down or sitting positions. This gives your blood pressure a chance to adjust.  Avoid hot showers and excessive heat as directed by your health care provider.  Return to your normal activities as told by your health care provider. Ask your health care provider what activities are safe for you.  Do not use any products that contain nicotine or tobacco, such as cigarettes, e-cigarettes, and chewing tobacco. If you need help quitting, ask your health care provider.  Keep all follow-up visits as told by your  health care provider. This is important. Contact a health care provider if you:  Vomit.  Have diarrhea.  Have a fever for more than 2-3 days.  Feel more thirsty than usual.  Feel weak and tired. Get help right away if you:  Have chest pain.  Have a fast or irregular heartbeat.  Develop numbness in any part of your body.  Cannot move your arms or your legs.  Have trouble speaking.  Become sweaty or feel light-headed.  Faint.  Feel short of breath.  Have trouble staying awake.  Feel confused. Summary  Orthostatic hypotension is a sudden drop in blood pressure that happens when you quickly change positions.  Orthostatic hypotension is usually not a serious problem.  It is diagnosed by having your blood pressure taken lying down, sitting, and then standing.  It may be treated by changing your diet or adjusting your medicines. This information is not intended to replace advice given to you by your health care provider. Make sure you discuss any questions you have with your health care provider. Document Revised: 12/03/2017 Document Reviewed: 12/03/2017 Elsevier Patient Education  2020 [employer].

## 2020-02-03 NOTE — Progress Notes (Signed)
Subjective:  Patient ID: Chase Carey, male    DOB: [birth year]  Age: 44 y.o. MRN: [id number]  CC: The primary encounter diagnosis was Hypotension due to hypovolemia. Diagnoses of Orthostatic hypotension and Overweight were also pertinent to this visit.  HPI Chase Carey presents for ER follow up. ent to [facility] ED on August 3 after having a Syncopal episode at work.  remembers standing up suddenly  After sitting comfortably for an hour and work up on the floor. Has had symptoms of presyncope , postural ,  For the past 3 years .  5 or 6 occurrences .  History of bariatric surgery 10 yrs ago.  No recent diarrhea or abdominal pain.  Has controlled dumping syndrome for the past 3 years ;  stools are formed and twice daily   Averages  At least 100 ounces of water daily .  Avoids salt.  Taking phentermine  Since April 21.   Has lost 35 lbs and feels great on it.  Elevated pulse noted.    Outpatient Medications Prior to Visit  Medication Sig Dispense Refill  . buPROPion (WELLBUTRIN XL) 300 MG 24 hr tablet TAKE 1 TABLET BY MOUTH EVERY DAY 30 tablet 2  . calcium carbonate (OS-CAL) 600 MG TABS Take 600 mg by mouth 2 (two) times daily with a meal.    . cholecalciferol (VITAMIN D) 1000 UNITS tablet Take 3,000 Units by mouth daily.     Marland Kitchen EPINEPHrine 0.3 mg/0.3 mL IJ SOAJ injection INJECT 0.3 MLS (0.3 MG TOTAL) INTO THE MUSCLE ONCE FOR 1 DOSE.    . hydrocortisone (ANUSOL-HC) 25 MG suppository Place 1 suppository (25 mg total) rectally 2 (two) times daily. 12 suppository 5  . ketoconazole (NIZORAL) 2 % shampoo SHAMPOO FACE, SCALP, AND EARS TOPICALLY TWICE A WEEK. LET LATHER SIT FOR 2 3 MINS. BEFORE RINSING.    Marland Kitchen levothyroxine (SYNTHROID) 75 MCG tablet TAKE 1 TABLET BY MOUTH EVERY DAY 30 tablet 1  . Multiple Vitamin (MULTIVITAMINS PO) Take by mouth daily.    . Multiple Vitamins-Minerals (ZINC PO) Take 1 tablet by mouth daily.    . phentermine (ADIPEX-P) 37.5 MG tablet TAKE 1 TABLET BY MOUTH DAILY  BEFORE BREAKFAST    . Probiotic Product (PROBIOTIC DAILY PO) Take by mouth daily.    . tamsulosin (FLOMAX) 0.4 MG CAPS capsule TAKE 2 CAPSULES BY MOUTH EVERY DAY 60 capsule 5  . vitamin B-12 (CYANOCOBALAMIN) 100 MCG tablet Take 100 mcg by mouth daily.     No facility-administered medications prior to visit.    Review of Systems;  Patient denies headache, fevers, malaise, unintentional weight loss, skin rash, eye pain, sinus congestion and sinus pain, sore throat, dysphagia,  hemoptysis , cough, dyspnea, wheezing, chest pain, palpitations, orthopnea, edema, abdominal pain, nausea, melena, diarrhea, constipation, flank pain, dysuria, hematuria, urinary  Frequency, nocturia, numbness, tingling, seizures,  Focal weakness, Loss of consciousness,  Tremor, insomnia, depression, anxiety, and suicidal ideation.      Objective:  BP 108/78 (BP Location: Left Arm, Patient Position: Sitting, Cuff Size: Large)   Pulse (!) 108   Temp 98 F (36.7 C) (Oral)   Resp 16   Ht 6\' 4"  (1.93 m)   Wt 283 lb 9.6 oz (128.6 kg)   SpO2 98%   BMI 34.52 kg/m   BP Readings from Last 3 Encounters:  02/03/20 108/78  01/17/20 126/82  10/21/19 133/74    Wt Readings from Last 3 Encounters:  02/03/20 283 lb 9.6 oz (128.6 kg)  01/17/20 (!) 290 lb (131.5 kg)  10/21/19 299 lb (135.6 kg)    General appearance: alert, cooperative and appears stated age Ears: normal TM's and external ear canals both ears Throat: lips, mucosa, and tongue normal; teeth and gums normal Neck: no adenopathy, no carotid bruit, supple, symmetrical, trachea midline and thyroid not enlarged, symmetric, no tenderness/mass/nodules Back: symmetric, no curvature. ROM normal. No CVA tenderness. Lungs: clear to auscultation bilaterally Heart: regular rate and rhythm, S1, S2 normal, no murmur, click, rub or gallop Abdomen: soft, non-tender; bowel sounds normal; no masses,  no organomegaly Pulses: 2+ and symmetric Skin: Skin color, texture, turgor  normal. No rashes or lesions Lymph nodes: Cervical, supraclavicular, and axillary nodes normal.  Lab Results  Component Value Date   HGBA1C 5.2 07/01/2017   HGBA1C 5.1 03/31/2016   HGBA1C 4.9 09/24/2012    Lab Results  Component Value Date   CREATININE 0.81 08/31/2019   CREATININE 0.86 10/27/2017   CREATININE 0.82 07/01/2017    Lab Results  Component Value Date   WBC 6.7 02/03/2020   HGB 12.4 (L) 02/03/2020   HCT 37.4 (L) 02/03/2020   PLT 247 02/03/2020   GLUCOSE 77 08/31/2019   CHOL 203 (H) 08/31/2019   TRIG 112.0 08/31/2019   HDL 62.70 08/31/2019   LDLDIRECT 117.0 08/31/2019   LDLCALC 118 (H) 08/31/2019   ALT 15 08/31/2019   AST 27 08/31/2019   NA 137 08/31/2019   K 3.9 08/31/2019   CL 102 08/31/2019   CREATININE 0.81 08/31/2019   BUN 14 08/31/2019   CO2 30 08/31/2019   TSH 4.79 (H) 10/12/2019   PSA 0.44 08/31/2019   HGBA1C 5.2 07/01/2017   MICROALBUR <0.7 08/31/2019    No results found.  Assessment & Plan:   Problem List Items Addressed This Visit      Unprioritized   Orthostatic hypotension    Etiology unclear.  He has not had dumping syndrome in several years and is maintaining adequate fluid hydration.  He is not suffficiently anemic,  And random cortisol is normal.   Advised to increase salt intake.  Random cortisol level is pending       Relevant Medications   metoprolol succinate (TOPROL-XL) 25 MG 24 hr tablet   Overweight    He has lost 35 lbs since starting phentermine and no evidence of side effects.       Other Visit Diagnoses    Hypotension due to hypovolemia    -  Primary   Relevant Medications   metoprolol succinate (TOPROL-XL) 25 MG 24 hr tablet   Other Relevant Orders   Cortisol (Completed)   CBC with Differential/Platelet (Completed)      I provided  30 minutes of  face-to-face time during this encounter reviewing patient's current problems and past surgeries, labs and imaging studies, providing counseling on the above  mentioned problems , and coordination  of care . I am having [name] start on metoprolol succinate. I am also having him maintain his vitamin B-12, calcium carbonate, Multiple Vitamin (MULTIVITAMINS PO), cholecalciferol, Multiple Vitamins-Minerals (ZINC PO), Probiotic Product (PROBIOTIC DAILY PO), hydrocortisone, ketoconazole, tamsulosin, EPINEPHrine, buPROPion, levothyroxine, and phentermine.  Meds ordered this encounter  Medications  . metoprolol succinate (TOPROL-XL) 25 MG 24 hr tablet    Sig: Take 1 tablet (25 mg total) by mouth daily.    Dispense:  90 tablet    Refill:  3    There are no discontinued medications.  Follow-up: Return in about 3 months (around 05/05/2020).  Crecencio Mc, MD

## 2020-02-04 LAB — CORTISOL: Cortisol, Plasma: 6 ug/dL

## 2020-02-05 DIAGNOSIS — I951 Orthostatic hypotension: Secondary | ICD-10-CM | POA: Insufficient documentation

## 2020-02-05 NOTE — Assessment & Plan Note (Signed)
He has lost 35 lbs since starting phentermine and no evidence of side effects.

## 2020-02-05 NOTE — Assessment & Plan Note (Addendum)
Etiology unclear.  He has not had dumping syndrome in several years and is maintaining adequate fluid hydration.  He is not suffficiently anemic,  And random cortisol is normal.   Advised to increase salt intake.  Random cortisol level is pending

## 2020-02-06 NOTE — Telephone Encounter (Signed)
Refill request for phentermine, last seen 02-03-20, last filled 12-18-19.  Please advise.

## 2020-02-07 ENCOUNTER — Telehealth: Payer: Self-pay | Admitting: Internal Medicine

## 2020-02-07 MED ORDER — PHENTERMINE HCL 37.5 MG PO TABS: ORAL_TABLET | ORAL | 2 refills | Status: DC

## 2020-02-07 NOTE — Addendum Note (Signed)
Addended by: Sherlene Shams on: 02/07/2020 09:45 AM   Modules accepted: Orders

## 2020-02-07 NOTE — Telephone Encounter (Signed)
Done

## 2020-02-07 NOTE — Telephone Encounter (Signed)
Pt called in need refill on phentermine (ADIPEX-P) 37.5 MG tablet

## 2020-03-28 ENCOUNTER — Other Ambulatory Visit: Payer: Self-pay | Admitting: Internal Medicine

## 2020-03-28 DIAGNOSIS — F411 Generalized anxiety disorder: Secondary | ICD-10-CM

## 2020-05-07 ENCOUNTER — Telehealth (INDEPENDENT_AMBULATORY_CARE_PROVIDER_SITE_OTHER): Payer: Managed Care, Other (non HMO) | Admitting: Internal Medicine

## 2020-05-07 ENCOUNTER — Encounter: Payer: Self-pay | Admitting: Internal Medicine

## 2020-05-07 VITALS — Ht 76.0 in | Wt 283.0 lb

## 2020-05-07 DIAGNOSIS — E039 Hypothyroidism, unspecified: Secondary | ICD-10-CM | POA: Diagnosis not present

## 2020-05-07 DIAGNOSIS — R7301 Impaired fasting glucose: Secondary | ICD-10-CM

## 2020-05-07 DIAGNOSIS — D509 Iron deficiency anemia, unspecified: Secondary | ICD-10-CM

## 2020-05-07 DIAGNOSIS — E6609 Other obesity due to excess calories: Secondary | ICD-10-CM

## 2020-05-07 DIAGNOSIS — E785 Hyperlipidemia, unspecified: Secondary | ICD-10-CM

## 2020-05-07 DIAGNOSIS — Z6833 Body mass index (BMI) 33.0-33.9, adult: Secondary | ICD-10-CM

## 2020-05-07 DIAGNOSIS — E559 Vitamin D deficiency, unspecified: Secondary | ICD-10-CM | POA: Diagnosis not present

## 2020-05-07 DIAGNOSIS — E538 Deficiency of other specified B group vitamins: Secondary | ICD-10-CM | POA: Diagnosis not present

## 2020-05-07 DIAGNOSIS — E1169 Type 2 diabetes mellitus with other specified complication: Secondary | ICD-10-CM

## 2020-05-07 MED ORDER — PHENTERMINE HCL 37.5 MG PO TABS
ORAL_TABLET | ORAL | 2 refills | Status: DC
Start: 2020-05-07 — End: 2021-03-08

## 2020-05-07 NOTE — Progress Notes (Signed)
Virtual Visit via Caregility  This visit type was conducted due to national recommendations for restrictions regarding the COVID-19 pandemic (e.g. social distancing).  This format is felt to be most appropriate for this patient at this time.  All issues noted in this document were discussed and addressed.  No physical exam was performed (except for noted visual exam findings with Video Visits).   I connected with@ on 05/07/20 at  8:00 AM EST by a video enabled telemedicine application and verified that I am speaking with the correct person using two identifiers. Location patient: home Location provider: work or home office Persons participating in the virtual visit: patient, provider  I discussed the limitations, risks, security and privacy concerns of performing an evaluation and management service by telephone and the availability of in person appointments. I also discussed with the patient that there may be a patient responsible charge related to this service. The patient expressed understanding and agreed to proceed.  Reason for visit: 3 month follow up on weight management, depressions  HPI:  44 yr old male with history of gastric bypass surgery,  hypothyroidism,  Depression , OSA ( Untreated)  and  Orthostatic hypotension here for 3 month follow up   1) Obesity:  Following his gastric bypass at a preoperative weight of 379 lbs and reaching a nadir of 229 lbs , he starting gaining weight and reached a weight of 318 lbs in April.  He was prescribed phentermine in April and lost 19 lbs during the first 3 months  And another 16 during the second 3. He returns today but has not weighed recently.  He reports that he has not been weight,  but continues to note decreasing size>  He has not been exercisng daily due to persistent  right ankle and left knee pain .  Seeing orthopedics for both.  Last steroid injection was not helpful in August.  He has been wearing an ankle brace to manage Right posterior  tibial tendon dysfunction with subtalar joint osteoarthritis   2) Depression:  Patient feeling better since starting wellbutrin .  He reports occasional episodes of feeling discouraged,  But denies persistent negative symptoms and mood fluctuation .   3) OSA:  His OSA remains untreated due to insurance non coverage of CPAP      ROS: See pertinent positives and negatives per HPI.  Past Medical History:  Diagnosis Date  . Anal fissure   . Anemia, unspecified   . Arthritis    ankle  . Asymptomatic varicose veins   . Balanoposthitis   . Cervicalgia   . Chronic headaches   . Elevated blood pressure reading without diagnosis of hypertension   . Gallstones   . GERD (gastroesophageal reflux disease)   . Localized superficial swelling, mass, or lump   . Lumbago   . Obesity, unspecified   . Other abnormal blood chemistry   . Other dyspnea and respiratory abnormality   . Plantar fascial fibromatosis   . Psychogenic pain, site unspecified   . Seborrhea capitis   . Sleep apnea    mild  . Sprain of neck   . Tuberculosis    positive PPD; negative chest x ray- 2000, took medication for 6 months    Past Surgical History:  Procedure Laterality Date  . GASTRIC BYPASS  10/17/2009  . HEMORRHOIDECTOMY WITH HEMORRHOID BANDING  2011  . HERNIA REPAIR  03/2016   internal at [facility]  . RLE varicose vein laser procedure  2011   [facility]  .  VASECTOMY  02/25/2013   no report of complications  . VEIN LIGATION AND STRIPPING Right 04/07/13   leg    Family History  Adopted: Yes  Problem Relation Age of Onset  . Other Other        adopted    SOCIAL HX:  reports that he has never smoked. He has never used smokeless tobacco. He reports that he does not drink alcohol and does not use drugs.  Current Outpatient Medications:  .  buPROPion (WELLBUTRIN XL) 300 MG 24 hr tablet, TAKE 1 TABLET BY MOUTH EVERY DAY, Disp: 90 tablet, Rfl: 1 .  calcium carbonate (OS-CAL) 600 MG TABS, Take 600 mg by mouth 2 (two)  times daily with a meal., Disp: , Rfl:  .  cholecalciferol (VITAMIN D) 1000 UNITS tablet, Take 3,000 Units by mouth daily. , Disp: , Rfl:  .  EPINEPHrine 0.3 mg/0.3 mL IJ SOAJ injection, INJECT 0.3 MLS (0.3 MG TOTAL) INTO THE MUSCLE ONCE FOR 1 DOSE., Disp: , Rfl:  .  hydrocortisone (ANUSOL-HC) 25 MG suppository, Place 1 suppository (25 mg total) rectally 2 (two) times daily., Disp: 12 suppository, Rfl: 5 .  ketoconazole (NIZORAL) 2 % shampoo, SHAMPOO FACE, SCALP, AND EARS TOPICALLY TWICE A WEEK. LET LATHER SIT FOR 2 3 MINS. BEFORE RINSING., Disp: , Rfl:  .  levothyroxine (SYNTHROID) 75 MCG tablet, TAKE 1 TABLET BY MOUTH EVERY DAY, Disp: 30 tablet, Rfl: 1 .  metoprolol succinate (TOPROL-XL) 25 MG 24 hr tablet, Take 1 tablet (25 mg total) by mouth daily., Disp: 90 tablet, Rfl: 3 .  Multiple Vitamin (MULTIVITAMINS PO), Take by mouth daily., Disp: , Rfl:  .  Multiple Vitamins-Minerals (ZINC PO), Take 1 tablet by mouth daily., Disp: , Rfl:  .  phentermine (ADIPEX-P) 37.5 MG tablet, TAKE 1 TABLET BY MOUTH DAILY BEFORE BREAKFAST, Disp: 30 tablet, Rfl: 2 .  Probiotic Product (PROBIOTIC DAILY PO), Take by mouth daily., Disp: , Rfl:  .  tamsulosin (FLOMAX) 0.4 MG CAPS capsule, TAKE 2 CAPSULES BY MOUTH EVERY DAY, Disp: 60 capsule, Rfl: 5 .  vitamin B-12 (CYANOCOBALAMIN) 100 MCG tablet, Take 100 mcg by mouth daily., Disp: , Rfl:   EXAM:  VITALS per patient if applicable:  GENERAL: alert, oriented, appears well and in no acute distress  HEENT: atraumatic, conjunttiva clear, no obvious abnormalities on inspection of external nose and ears  NECK: normal movements of the head and neck  LUNGS: on inspection no signs of respiratory distress, breathing rate appears normal, no obvious gross SOB, gasping or wheezing  CV: no obvious cyanosis  MS: moves all visible extremities without noticeable abnormality  PSYCH/NEURO: pleasant and cooperative, no obvious depression or anxiety, speech and thought processing  grossly intact  ASSESSMENT AND PLAN:  Discussed the following assessment and plan:  Hypothyroidism (acquired) - Plan: Thyroid Panel With TSH  Vitamin D deficiency - Plan: VITAMIN D 25 Hydroxy (Vit-D Deficiency, Fractures)  B12 deficiency - Plan: Intrinsic Factor Antibodies, Vitamin B12  Iron deficiency anemia, unspecified iron deficiency anemia type - Plan: Iron, TIBC and Ferritin Panel, CBC with Differential/Platelet  Hyperlipidemia associated with type 2 diabetes mellitus (HCC)  Dyslipidemia - Plan: Lipid panel, Comprehensive metabolic panel  Impaired fasting glucose - Plan: Comprehensive metabolic panel, Hemoglobin A1c  Class 1 obesity due to excess calories without serious comorbidity with body mass index (BMI) of 33.0 to 33.9 in adult  Obesity Warned that the phentermine lenth of therapy is nearing a close  .  Will continue through the holidays.  advisex     I discussed the assessment and treatment plan with the patient. The patient was provided an opportunity to ask questions and all were answered. The patient agreed with the plan and demonstrated an understanding of the instructions.   The patient was advised to call back or seek an in-person evaluation if the symptoms worsen or if the condition fails to improve as anticipated.  I provided 30 minutes of face-to-face time during this encounter.   Sherlene Shams, MD

## 2020-05-07 NOTE — Assessment & Plan Note (Signed)
Warned that the phentermine lenth of therapy is nearing a close  .  Will continue through the holidays.  advisex

## 2020-05-10 ENCOUNTER — Other Ambulatory Visit: Payer: Self-pay | Admitting: Internal Medicine

## 2020-06-06 ENCOUNTER — Other Ambulatory Visit: Payer: Self-pay | Admitting: Internal Medicine

## 2020-06-27 LAB — HM COLONOSCOPY

## 2020-09-21 HISTORY — PX: ROTATOR CUFF REPAIR: SHX139

## 2020-10-03 ENCOUNTER — Other Ambulatory Visit: Payer: Self-pay | Admitting: Internal Medicine

## 2021-02-13 DIAGNOSIS — S46011D Strain of muscle(s) and tendon(s) of the rotator cuff of right shoulder, subsequent encounter: Secondary | ICD-10-CM | POA: Insufficient documentation

## 2021-02-21 ENCOUNTER — Other Ambulatory Visit: Payer: Self-pay | Admitting: Internal Medicine

## 2021-03-08 ENCOUNTER — Ambulatory Visit: Payer: Managed Care, Other (non HMO) | Admitting: Internal Medicine

## 2021-03-08 ENCOUNTER — Other Ambulatory Visit: Payer: Self-pay

## 2021-03-08 ENCOUNTER — Encounter: Payer: Self-pay | Admitting: Internal Medicine

## 2021-03-08 VITALS — BP 114/72 | HR 96 | Temp 96.6°F | Ht 76.0 in | Wt 273.4 lb

## 2021-03-08 DIAGNOSIS — K648 Other hemorrhoids: Secondary | ICD-10-CM

## 2021-03-08 DIAGNOSIS — G4733 Obstructive sleep apnea (adult) (pediatric): Secondary | ICD-10-CM

## 2021-03-08 DIAGNOSIS — M26609 Unspecified temporomandibular joint disorder, unspecified side: Secondary | ICD-10-CM | POA: Diagnosis not present

## 2021-03-08 DIAGNOSIS — Z23 Encounter for immunization: Secondary | ICD-10-CM | POA: Diagnosis not present

## 2021-03-08 DIAGNOSIS — R7301 Impaired fasting glucose: Secondary | ICD-10-CM

## 2021-03-08 DIAGNOSIS — Z9884 Bariatric surgery status: Secondary | ICD-10-CM

## 2021-03-08 DIAGNOSIS — E538 Deficiency of other specified B group vitamins: Secondary | ICD-10-CM

## 2021-03-08 DIAGNOSIS — S4991XD Unspecified injury of right shoulder and upper arm, subsequent encounter: Secondary | ICD-10-CM

## 2021-03-08 DIAGNOSIS — E039 Hypothyroidism, unspecified: Secondary | ICD-10-CM

## 2021-03-08 DIAGNOSIS — S4991XS Unspecified injury of right shoulder and upper arm, sequela: Secondary | ICD-10-CM

## 2021-03-08 DIAGNOSIS — E785 Hyperlipidemia, unspecified: Secondary | ICD-10-CM | POA: Diagnosis not present

## 2021-03-08 DIAGNOSIS — S0300XA Dislocation of jaw, unspecified side, initial encounter: Secondary | ICD-10-CM

## 2021-03-08 DIAGNOSIS — E559 Vitamin D deficiency, unspecified: Secondary | ICD-10-CM

## 2021-03-08 DIAGNOSIS — D509 Iron deficiency anemia, unspecified: Secondary | ICD-10-CM

## 2021-03-08 DIAGNOSIS — K2100 Gastro-esophageal reflux disease with esophagitis, without bleeding: Secondary | ICD-10-CM

## 2021-03-08 MED ORDER — TIZANIDINE HCL 4 MG PO TABS: 4.0000 mg | ORAL_TABLET | Freq: Four times a day (QID) | ORAL | 0 refills | Status: DC | PRN

## 2021-03-08 NOTE — Addendum Note (Signed)
Addended by: Warden Fillers on: 03/08/2021 02:34 PM   Modules accepted: Orders

## 2021-03-08 NOTE — Patient Instructions (Addendum)
TRIAL OF TIZANIDINE ,  A MUSCLE RELAXER FOR YOUR TMJ SYMPTOMS. START WITH A DOSE AT NIGHT   NO GUM CHEWING.  NO BIG SANDWICHES   X RAYS OF JAW ORDERED FOR YOU TO GET NEXT WEEK OR THIS AFTERNOON AT [facility]

## 2021-03-08 NOTE — Progress Notes (Signed)
Subjective:  Patient ID: Chase Carey, male    DOB: [birth year]  Age: 45 y.o. MRN: [id number]  CC: The primary encounter diagnosis was TMJ (temporomandibular joint syndrome). Diagnoses of Dyslipidemia, B12 deficiency, Vitamin D deficiency, Impaired fasting glucose, Iron deficiency anemia, unspecified iron deficiency anemia type, Hypothyroidism (acquired), Need for immunization against influenza, S/P gastric bypass, OSA (obstructive sleep apnea), Right shoulder injury, sequela, Gastroesophageal reflux disease with esophagitis without hemorrhage, and Internal hemorrhoids were also pertinent to this visit.  HPI Chase Carey presents for  follow up  Last seen Nov 2021 video visit Chief Complaint  Patient presents with   Acute Visit    Pain in jaw when opening mouth   This visit occurred during the SARS-CoV-2 public health emergency.  Safety protocols were in place, including screening questions prior to the visit, additional usage of staff PPE, and extensive cleaning of exam room while observing appropriate contact time as indicated for disinfecting solutions.   1) TMJ symptoms:  pain in right jaw with opening mouth . Heard a crack recently accompanied by pain when he opened mouth to  yell to son. Months ago   2) Recent traumatic injury to right shoulder which occurred at work . 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  Occurred during snow fall s/p surgery on rotator  cuff f by Dr Sherre Poot at [facility] after not improving with PT.  Surgery was to repair a  torn bicep and  partial tears of two RC endons .  No pain , but RoM is not at goal yet. Has been out of work since I Surgery April 29  ,  follow up mid October   3) Positive COVID test June 30. Mild symptoms (real bad cold)  no anosmia just HA and sinus chills without fever   4) obesity: he is 11 years out from a   gastric bypass, reached a nadir of 212, has had difficulty maintaining a healthy weight, his goal was 230 lbs .  He is walking daily    Outpatient Medications Prior to Visit  Medication Sig Dispense Refill   calcium carbonate (OS-CAL) 600 MG TABS Take 600 mg by mouth 2 (two) times daily with a meal.     cholecalciferol (VITAMIN D) 1000 UNITS tablet Take 3,000 Units by mouth daily.      EPINEPHrine 0.3 mg/0.3 mL IJ SOAJ injection INJECT 0.3 MLS (0.3 MG TOTAL) INTO THE MUSCLE ONCE FOR 1 DOSE.     hydrocortisone (ANUSOL-HC) 25 MG suppository Place 1 suppository (25 mg total) rectally 2 (two) times daily. 12 suppository 5   ketoconazole (NIZORAL) 2 % shampoo SHAMPOO FACE, SCALP, AND EARS TOPICALLY TWICE A WEEK. LET LATHER SIT FOR 2 3 MINS. BEFORE RINSING.     metoprolol succinate (TOPROL-XL) 25 MG 24 hr tablet Take 1 tablet (25 mg total) by mouth daily. 90 tablet 3   Multiple Vitamin (MULTIVITAMINS PO) Take by mouth daily.     Multiple Vitamins-Minerals (ZINC PO) Take 1 tablet by mouth daily.     Probiotic Product (PROBIOTIC DAILY PO) Take by mouth daily.     tamsulosin (FLOMAX) 0.4 MG CAPS capsule TAKE 2 CAPSULES BY MOUTH EVERY DAY 60 capsule 5   vitamin B-12 (CYANOCOBALAMIN) 100 MCG tablet Take 100 mcg by mouth daily.     levothyroxine (SYNTHROID) 75 MCG tablet TAKE 1 TABLET BY  MOUTH EVERY DAY 30 tablet 1   buPROPion (WELLBUTRIN XL) 300 MG 24 hr tablet TAKE 1 TABLET BY MOUTH EVERY DAY (Patient not taking: Reported on 03/08/2021) 90 tablet 1   phentermine (ADIPEX-P) 37.5 MG tablet TAKE 1 TABLET BY MOUTH DAILY BEFORE BREAKFAST (Patient not taking: Reported on 03/08/2021) 30 tablet 2   No facility-administered medications prior to visit.    Review of Systems;  Patient denies headache, fevers, malaise, unintentional weight loss, skin rash, eye pain, sinus congestion and sinus pain, sore throat, dysphagia,  hemoptysis , cough, dyspnea, wheezing, chest pain, palpitations, orthopnea, edema, abdominal  pain, nausea, melena, diarrhea, constipation, flank pain, dysuria, hematuria, urinary  Frequency, nocturia, numbness, tingling, seizures,  Focal weakness, Loss of consciousness,  Tremor, insomnia, depression, anxiety, and suicidal ideation.      Objective:  BP 114/72 (BP Location: Left Arm, Patient Position: Sitting, Cuff Size: Large)   Pulse 96   Temp (!) 96.6 F (35.9 C) (Temporal)   Ht 6\' 4"  (1.93 m)   Wt 273 lb 6.4 oz (124 kg)   SpO2 98%   BMI 33.28 kg/m   BP Readings from Last 3 Encounters:  03/08/21 114/72  02/03/20 108/78  01/17/20 126/82    Wt Readings from Last 3 Encounters:  03/08/21 273 lb 6.4 oz (124 kg)  05/07/20 283 lb (128.4 kg)  02/03/20 283 lb 9.6 oz (128.6 kg)    General appearance: alert, cooperative and appears stated age Ears: normal TM's and external ear canals both ears Throat: lips, mucosa, and tongue normal; teeth and gums normal Neck: no adenopathy, no carotid bruit, supple, symmetrical, trachea midline and thyroid not enlarged, symmetric, no tenderness/mass/nodules Back: symmetric, no curvature. ROM normal. No CVA tenderness. Lungs: clear to auscultation bilaterally Heart: regular rate and rhythm, S1, S2 normal, no murmur, click, rub or gallop Abdomen: soft, non-tender; bowel sounds normal; no masses,  no organomegaly Pulses: 2+ and symmetric Skin: Skin color, texture, turgor normal. No rashes or lesions Lymph nodes: Cervical, supraclavicular, and axillary nodes normal.  Lab Results  Component Value Date   HGBA1C 5.0 03/08/2021   HGBA1C 5.2 07/01/2017   HGBA1C 5.1 03/31/2016    Lab Results  Component Value Date   CREATININE 0.76 03/08/2021   CREATININE 0.81 08/31/2019   CREATININE 0.86 10/27/2017    Lab Results  Component Value Date   WBC 6.1 03/08/2021   HGB 12.3 (L) 03/08/2021   HCT 38.0 (L) 03/08/2021   PLT 223 03/08/2021   GLUCOSE 102 (H) 03/08/2021   CHOL 198 03/08/2021   TRIG 206 (H) 03/08/2021   HDL 63 03/08/2021    LDLDIRECT 117.0 08/31/2019   LDLCALC 103 (H) 03/08/2021   ALT 14 03/08/2021   AST 27 03/08/2021   NA 137 03/08/2021   K 4.4 03/08/2021   CL 101 03/08/2021   CREATININE 0.76 03/08/2021   BUN 18 03/08/2021   CO2 28 03/08/2021   TSH 7.14 (H) 03/08/2021   PSA 0.44 08/31/2019   HGBA1C 5.0 03/08/2021   MICROALBUR <0.7 08/31/2019    No results found.  Assessment & Plan:   Problem List Items Addressed This Visit       Unprioritized   S/P gastric bypass    After reaching a nadir of 232 lbs  In 2015,  He has regained 40 lbs .  Body mass index is 33.28 kg/m. I have addressed  BMI and recommended wt loss of 10% of body weight over the next 6 months using a low glycemic index  diet and regular exercise a minimum of 5 days per week.  Printed information for [employer] given, and encouraged to resume support group meetings       Anemia    Persistent.  With normal  iron,  b12 and a positive FOBT in July 2021 . EDiagnsotic EGD and colonoscopy jan 2021 at [facility] revealed no ulcers or masses.   . Lab Results  Component Value Date   WBC 6.1 03/08/2021   HGB 12.3 (L) 03/08/2021   HCT 38.0 (L) 03/08/2021   MCV 95.5 03/08/2021   PLT 223 03/08/2021         OSA (obstructive sleep apnea)    Diagnosed by prior sleep study. Patient is using CPAP every night a minimum of 6 hours per night and notes improved daytime wakefulness and decreased fatigue       Hypothyroidism (acquired)    Thyroid function is low on 75 mcg . Increasing dose to 88 mcg daily       Relevant Medications   levothyroxine (SYNTHROID) 88 MCG tablet   TMJ (temporomandibular joint syndrome) - Primary    Plain films of jaw ordered.  dietary measures outlined,  Muscle relaxer prescribed.  He cannot use NSAIDS due to gastric bypass surgery      Relevant Orders   DG Orthopantogram   Dyslipidemia   B12 deficiency   Impaired fasting glucose    He has no signs of diabetes by a1c    Lab Results   Component Value Date   HGBA1C 5.0 03/08/2021         Right shoulder injury, sequela    Occurred during work,  Resulting in biceps tear and 2 partial  rotator cuff muscle tears. Managed with PT followed by surgery to repair tendons.  Has been out of work since late April .  ROM improving but not at goal ; continue PT      Reflux esophagitis    Noted on 2022 EGD.  Continue PPI indefinitely      Internal hemorrhoids    Noted on jan 2022 colonoscopy      Vitamin D deficiency    D is low on 3000 Ius daily.  rx megadose weekly       Other Visit Diagnoses     Need for immunization against influenza       Relevant Orders   Flu Vaccine QUAD 29mo+IM (Fluarix, Fluzone & Alfiuria Quad PF) (Completed)       I spent 30 mintutes dedicated to the care of this patient on the date of this encounter to include pre-visit review of his medical history,  Face-to-face time with the patient , and post visit ordering of testing and therapeutics.   Meds ordered this encounter  Medications   tiZANidine (ZANAFLEX) 4 MG tablet    Sig: Take 1 tablet (4 mg total) by mouth every 6 (six) hours as needed for muscle spasms.    Dispense:  30 tablet    Refill:  0   levothyroxine (SYNTHROID) 88 MCG tablet    Sig: Take 1 tablet (88 mcg total) by mouth daily.    Dispense:  90 tablet    Refill:  1   ergocalciferol (DRISDOL) 1.25 MG (50000 UT) capsule    Sig: Take 1 capsule (50,000 Units total) by mouth once a week.    Dispense:  4 capsule    Refill:  3    Medications Discontinued During This Encounter  Medication Reason   buPROPion (WELLBUTRIN XL)  300 MG 24 hr tablet    phentermine (ADIPEX-P) 37.5 MG tablet    levothyroxine (SYNTHROID) 75 MCG tablet     Follow-up: No follow-ups on file.   Sherlene Shams, MD

## 2021-03-10 DIAGNOSIS — R7301 Impaired fasting glucose: Secondary | ICD-10-CM | POA: Insufficient documentation

## 2021-03-10 DIAGNOSIS — E559 Vitamin D deficiency, unspecified: Secondary | ICD-10-CM | POA: Insufficient documentation

## 2021-03-10 DIAGNOSIS — E785 Hyperlipidemia, unspecified: Secondary | ICD-10-CM | POA: Insufficient documentation

## 2021-03-10 DIAGNOSIS — M26609 Unspecified temporomandibular joint disorder, unspecified side: Secondary | ICD-10-CM | POA: Insufficient documentation

## 2021-03-10 DIAGNOSIS — K648 Other hemorrhoids: Secondary | ICD-10-CM | POA: Insufficient documentation

## 2021-03-10 DIAGNOSIS — E538 Deficiency of other specified B group vitamins: Secondary | ICD-10-CM | POA: Insufficient documentation

## 2021-03-10 DIAGNOSIS — K2100 Gastro-esophageal reflux disease with esophagitis, without bleeding: Secondary | ICD-10-CM | POA: Insufficient documentation

## 2021-03-10 DIAGNOSIS — S4991XS Unspecified injury of right shoulder and upper arm, sequela: Secondary | ICD-10-CM | POA: Insufficient documentation

## 2021-03-10 MED ORDER — LEVOTHYROXINE SODIUM 88 MCG PO TABS: 88.0000 ug | ORAL_TABLET | Freq: Every day | ORAL | 1 refills | Status: DC

## 2021-03-10 MED ORDER — ERGOCALCIFEROL 1.25 MG (50000 UT) PO CAPS: 50000.0000 [IU] | ORAL_CAPSULE | ORAL | 3 refills | Status: DC

## 2021-03-10 NOTE — Assessment & Plan Note (Signed)
He has no signs of diabetes by a1c    Lab Results  Component Value Date   HGBA1C 5.0 03/08/2021

## 2021-03-10 NOTE — Assessment & Plan Note (Signed)
Noted on 2022 EGD.  Continue PPI indefinitely

## 2021-03-10 NOTE — Assessment & Plan Note (Signed)
After reaching a nadir of 232 lbs  In 2015,  He has regained 40 lbs .  Body mass index is 33.28 kg/m. I have addressed  BMI and recommended wt loss of 10% of body weight over the next 6 months using a low glycemic index diet and regular exercise a minimum of 5 days per week.  Printed information for Chase Carey given, and encouraged to resume support group meetings

## 2021-03-10 NOTE — Assessment & Plan Note (Addendum)
Occurred during work,  Resulting in biceps tear and 2 partial  rotator cuff muscle tears. Managed with PT followed by surgery to repair tendons.  Has been out of work since late April .  ROM improving but not at goal ; continue PT

## 2021-03-10 NOTE — Assessment & Plan Note (Signed)
Noted on jan 2022 colonoscopy

## 2021-03-10 NOTE — Assessment & Plan Note (Signed)
Thyroid function is low on 75 mcg . Increasing dose to 88 mcg daily

## 2021-03-10 NOTE — Assessment & Plan Note (Addendum)
Persistent.  With normal  iron,  b12 and a positive FOBT in July 2021 . EDiagnsotic EGD and colonoscopy jan 2021 at [facility] revealed no ulcers or masses.   . Lab Results  Component Value Date   WBC 6.1 03/08/2021   HGB 12.3 (L) 03/08/2021   HCT 38.0 (L) 03/08/2021   MCV 95.5 03/08/2021   PLT 223 03/08/2021

## 2021-03-10 NOTE — Assessment & Plan Note (Signed)
D is low on 3000 Ius daily.  rx megadose weekly

## 2021-03-10 NOTE — Assessment & Plan Note (Signed)
Plain films of jaw ordered.  dietary measures outlined,  Muscle relaxer prescribed.  He cannot use NSAIDS due to gastric bypass surgery

## 2021-03-10 NOTE — Assessment & Plan Note (Addendum)
Diagnosed by prior sleep study. Patient is using CPAP every night a minimum of 6 hours per night and notes improved daytime wakefulness and decreased fatigue  

## 2021-03-11 LAB — IRON,TIBC AND FERRITIN PANEL
%SAT: 14 % (calc) — ABNORMAL LOW (ref 20–48)
Ferritin: 21 ng/mL — ABNORMAL LOW (ref 38–380)
Iron: 56 ug/dL (ref 50–180)
TIBC: 391 mcg/dL (calc) (ref 250–425)

## 2021-03-11 LAB — COMPREHENSIVE METABOLIC PANEL
AG Ratio: 1.6 (calc) (ref 1.0–2.5)
ALT: 14 U/L (ref 9–46)
AST: 27 U/L (ref 10–40)
Albumin: 4.2 g/dL (ref 3.6–5.1)
Alkaline phosphatase (APISO): 72 U/L (ref 36–130)
BUN: 18 mg/dL (ref 7–25)
CO2: 28 mmol/L (ref 20–32)
Calcium: 9.1 mg/dL (ref 8.6–10.3)
Chloride: 101 mmol/L (ref 98–110)
Creat: 0.76 mg/dL (ref 0.60–1.29)
Globulin: 2.6 g/dL (calc) (ref 1.9–3.7)
Glucose, Bld: 102 mg/dL — ABNORMAL HIGH (ref 65–99)
Potassium: 4.4 mmol/L (ref 3.5–5.3)
Sodium: 137 mmol/L (ref 135–146)
Total Bilirubin: 0.6 mg/dL (ref 0.2–1.2)
Total Protein: 6.8 g/dL (ref 6.1–8.1)

## 2021-03-11 LAB — CBC WITH DIFFERENTIAL/PLATELET
Absolute Monocytes: 458 cells/uL (ref 200–950)
Basophils Absolute: 43 cells/uL (ref 0–200)
Basophils Relative: 0.7 %
Eosinophils Absolute: 153 cells/uL (ref 15–500)
Eosinophils Relative: 2.5 %
HCT: 38 % — ABNORMAL LOW (ref 38.5–50.0)
Hemoglobin: 12.3 g/dL — ABNORMAL LOW (ref 13.2–17.1)
Lymphs Abs: 2172 cells/uL (ref 850–3900)
MCH: 30.9 pg (ref 27.0–33.0)
MCHC: 32.4 g/dL (ref 32.0–36.0)
MCV: 95.5 fL (ref 80.0–100.0)
MPV: 10.9 fL (ref 7.5–12.5)
Monocytes Relative: 7.5 %
Neutro Abs: 3276 cells/uL (ref 1500–7800)
Neutrophils Relative %: 53.7 %
Platelets: 223 10*3/uL (ref 140–400)
RBC: 3.98 10*6/uL — ABNORMAL LOW (ref 4.20–5.80)
RDW: 12.1 % (ref 11.0–15.0)
Total Lymphocyte: 35.6 %
WBC: 6.1 10*3/uL (ref 3.8–10.8)

## 2021-03-11 LAB — LIPID PANEL
Cholesterol: 198 mg/dL (ref <200)
HDL: 63 mg/dL (ref >=40)
LDL Cholesterol (Calc): 103 mg/dL (calc) — ABNORMAL HIGH
Non-HDL Cholesterol (Calc): 135 mg/dL (calc) — ABNORMAL HIGH (ref <130)
Total CHOL/HDL Ratio: 3.1 (calc) (ref <5.0)
Triglycerides: 206 mg/dL — ABNORMAL HIGH (ref <150)

## 2021-03-11 LAB — VITAMIN D 25 HYDROXY (VIT D DEFICIENCY, FRACTURES): Vit D, 25-Hydroxy: 24 ng/mL — ABNORMAL LOW (ref 30–100)

## 2021-03-11 LAB — VITAMIN B12: Vitamin B-12: 468 pg/mL (ref 200–1100)

## 2021-03-11 LAB — THYROID PANEL WITH TSH
Free Thyroxine Index: 2.3 (ref 1.4–3.8)
T3 Uptake: 31 % (ref 22–35)
T4, Total: 7.3 ug/dL (ref 4.9–10.5)
TSH: 7.14 mIU/L — ABNORMAL HIGH (ref 0.40–4.50)

## 2021-03-11 LAB — HEMOGLOBIN A1C
Hgb A1c MFr Bld: 5 % of total Hgb (ref <5.7)
Mean Plasma Glucose: 97 mg/dL
eAG (mmol/L): 5.4 mmol/L

## 2021-03-11 LAB — INTRINSIC FACTOR ANTIBODIES: Intrinsic Factor: NEGATIVE

## 2021-04-02 ENCOUNTER — Other Ambulatory Visit: Payer: Self-pay | Admitting: Internal Medicine

## 2021-08-02 ENCOUNTER — Other Ambulatory Visit: Payer: Self-pay | Admitting: Internal Medicine

## 2021-10-12 ENCOUNTER — Other Ambulatory Visit: Payer: Self-pay | Admitting: Internal Medicine

## 2021-12-08 ENCOUNTER — Other Ambulatory Visit: Payer: Self-pay | Admitting: Internal Medicine

## 2022-04-04 ENCOUNTER — Other Ambulatory Visit: Payer: Self-pay | Admitting: Internal Medicine

## 2022-04-17 LAB — BASIC METABOLIC PANEL
BUN: 8 (ref 4–21)
Creatinine: 0.7 (ref 0.6–1.3)

## 2022-04-17 LAB — CBC AND DIFFERENTIAL
Hemoglobin: 11.8 — AB (ref 13.5–17.5)
Platelets: 175 10*3/uL (ref 150–400)
WBC: 9.6

## 2022-04-24 LAB — COMPREHENSIVE METABOLIC PANEL
Albumin: 3.1 — AB (ref 3.5–5.0)
Albumin: 3.4 — AB (ref 3.5–5.0)
Calcium: 8.4 — AB (ref 8.7–10.7)

## 2022-04-24 LAB — HEPATIC FUNCTION PANEL
ALT: 17 U/L (ref 10–40)
ALT: 19 U/L (ref 10–40)
AST: 33 (ref 14–40)
Alkaline Phosphatase: 71 (ref 25–125)
Bilirubin, Total: 1.3

## 2022-04-24 LAB — VITAMIN B12: Vitamin B-12: 804

## 2022-04-24 LAB — IRON,TIBC AND FERRITIN PANEL
%SAT: 18
Iron: 61
Iron: 64
TIBC: 362

## 2022-04-24 LAB — BASIC METABOLIC PANEL: Glucose: 81

## 2022-04-24 LAB — VITAMIN D 25 HYDROXY (VIT D DEFICIENCY, FRACTURES): Vit D, 25-Hydroxy: 20

## 2022-05-01 ENCOUNTER — Encounter: Payer: Self-pay | Admitting: *Deleted

## 2022-05-27 ENCOUNTER — Encounter: Payer: Self-pay | Admitting: Internal Medicine

## 2022-05-27 ENCOUNTER — Ambulatory Visit: Payer: Managed Care, Other (non HMO) | Admitting: Internal Medicine

## 2022-05-27 VITALS — BP 124/86 | HR 77 | Temp 97.6°F | Ht 76.0 in | Wt 271.8 lb

## 2022-05-27 DIAGNOSIS — D649 Anemia, unspecified: Secondary | ICD-10-CM

## 2022-05-27 DIAGNOSIS — Z125 Encounter for screening for malignant neoplasm of prostate: Secondary | ICD-10-CM | POA: Diagnosis not present

## 2022-05-27 DIAGNOSIS — R350 Frequency of micturition: Secondary | ICD-10-CM

## 2022-05-27 DIAGNOSIS — Z9884 Bariatric surgery status: Secondary | ICD-10-CM

## 2022-05-27 DIAGNOSIS — I10 Essential (primary) hypertension: Secondary | ICD-10-CM | POA: Insufficient documentation

## 2022-05-27 DIAGNOSIS — E039 Hypothyroidism, unspecified: Secondary | ICD-10-CM | POA: Diagnosis not present

## 2022-05-27 DIAGNOSIS — E538 Deficiency of other specified B group vitamins: Secondary | ICD-10-CM

## 2022-05-27 DIAGNOSIS — R7301 Impaired fasting glucose: Secondary | ICD-10-CM | POA: Diagnosis not present

## 2022-05-27 DIAGNOSIS — Z9049 Acquired absence of other specified parts of digestive tract: Secondary | ICD-10-CM

## 2022-05-27 DIAGNOSIS — R5383 Other fatigue: Secondary | ICD-10-CM

## 2022-05-27 DIAGNOSIS — R030 Elevated blood-pressure reading, without diagnosis of hypertension: Secondary | ICD-10-CM | POA: Diagnosis not present

## 2022-05-27 DIAGNOSIS — E785 Hyperlipidemia, unspecified: Secondary | ICD-10-CM

## 2022-05-27 DIAGNOSIS — Z0000 Encounter for general adult medical examination without abnormal findings: Secondary | ICD-10-CM | POA: Diagnosis not present

## 2022-05-27 DIAGNOSIS — N401 Enlarged prostate with lower urinary tract symptoms: Secondary | ICD-10-CM

## 2022-05-27 DIAGNOSIS — N138 Other obstructive and reflux uropathy: Secondary | ICD-10-CM

## 2022-05-27 LAB — CBC WITH DIFFERENTIAL/PLATELET
Basophils Absolute: 0 10*3/uL (ref 0.0–0.1)
Basophils Relative: 0.4 % (ref 0.0–3.0)
Eosinophils Absolute: 0.1 10*3/uL (ref 0.0–0.7)
Eosinophils Relative: 1.8 % (ref 0.0–5.0)
HCT: 37.6 % — ABNORMAL LOW (ref 39.0–52.0)
Hemoglobin: 12.4 g/dL — ABNORMAL LOW (ref 13.0–17.0)
Lymphocytes Relative: 33.4 % (ref 12.0–46.0)
Lymphs Abs: 2.2 10*3/uL (ref 0.7–4.0)
MCHC: 33.1 g/dL (ref 30.0–36.0)
MCV: 97.5 fl (ref 78.0–100.0)
Monocytes Absolute: 0.6 10*3/uL (ref 0.1–1.0)
Monocytes Relative: 9.4 % (ref 3.0–12.0)
Neutro Abs: 3.6 10*3/uL (ref 1.4–7.7)
Neutrophils Relative %: 55 % (ref 43.0–77.0)
Platelets: 214 10*3/uL (ref 150.0–400.0)
RBC: 3.85 Mil/uL — ABNORMAL LOW (ref 4.22–5.81)
RDW: 13.5 % (ref 11.5–15.5)
WBC: 6.6 10*3/uL (ref 4.0–10.5)

## 2022-05-27 LAB — MICROALBUMIN / CREATININE URINE RATIO
Creatinine,U: 109.2 mg/dL
Microalb Creat Ratio: 0.6 mg/g (ref 0.0–30.0)
Microalb, Ur: <0.7 mg/dL (ref 0.0–1.9)

## 2022-05-27 LAB — LIPID PANEL
Cholesterol: 204 mg/dL — ABNORMAL HIGH (ref 0–200)
HDL: 77.2 mg/dL (ref >39.00)
LDL Cholesterol: 97 mg/dL (ref 0–99)
NonHDL: 126.69
Total CHOL/HDL Ratio: 3
Triglycerides: 150 mg/dL — ABNORMAL HIGH (ref 0.0–149.0)
VLDL: 30 mg/dL (ref 0.0–40.0)

## 2022-05-27 LAB — COMPREHENSIVE METABOLIC PANEL
ALT: 18 U/L (ref 0–53)
AST: 26 U/L (ref 0–37)
Albumin: 4.3 g/dL (ref 3.5–5.2)
Alkaline Phosphatase: 78 U/L (ref 39–117)
BUN: 13 mg/dL (ref 6–23)
CO2: 30 mEq/L (ref 19–32)
Calcium: 8.8 mg/dL (ref 8.4–10.5)
Chloride: 100 mEq/L (ref 96–112)
Creatinine, Ser: 0.7 mg/dL (ref 0.40–1.50)
GFR: 109.11 mL/min (ref >60.00)
Glucose, Bld: 73 mg/dL (ref 70–99)
Potassium: 4 mEq/L (ref 3.5–5.1)
Sodium: 138 mEq/L (ref 135–145)
Total Bilirubin: 0.6 mg/dL (ref 0.2–1.2)
Total Protein: 7 g/dL (ref 6.0–8.3)

## 2022-05-27 LAB — PSA: PSA: 0.64 ng/mL (ref 0.10–4.00)

## 2022-05-27 LAB — HEMOGLOBIN A1C: Hgb A1c MFr Bld: 5.1 % (ref 4.6–6.5)

## 2022-05-27 LAB — MAGNESIUM: Magnesium: 2 mg/dL (ref 1.5–2.5)

## 2022-05-27 LAB — TSH: TSH: 10.43 u[IU]/mL — ABNORMAL HIGH (ref 0.35–5.50)

## 2022-05-27 LAB — LDL CHOLESTEROL, DIRECT: Direct LDL: 105 mg/dL

## 2022-05-27 MED ORDER — KETOCONAZOLE 2 % EX SHAM: MEDICATED_SHAMPOO | CUTANEOUS | 2 refills | Status: AC

## 2022-05-27 MED ORDER — TAMSULOSIN HCL 0.4 MG PO CAPS: 0.8000 mg | ORAL_CAPSULE | Freq: Every day | ORAL | 5 refills | Status: DC

## 2022-05-27 NOTE — Assessment & Plan Note (Signed)

## 2022-05-27 NOTE — Progress Notes (Signed)
The patient is here for annual preventive examination and management of other chronic and acute problems.   The risk factors are reflected in the social history.   The roster of all physicians providing medical care to patient - is listed in the Snapshot section of the chart.   Activities of daily living:  The patient is 100% independent in all ADLs: dressing, toileting, feeding as well as independent mobility   Home safety : The patient has smoke detectors in the home. They wear seatbelts.  There are no unsecured firearms at home. There is no violence in the home.    There is no risks for hepatitis, STDs or HIV. There is no   history of blood transfusion. They have no travel history to infectious disease endemic areas of the world.   The patient has seen their dentist in the last six month. They have seen their eye doctor in the last year. The patinet  denies slight hearing difficulty with regard to whispered voices and some television programs.  They have deferred audiologic testing in the last year.  They do not  have excessive sun exposure. Discussed the need for sun protection: hats, long sleeves and use of sunscreen if there is significant sun exposure.    Diet: the importance of a healthy diet is discussed. They do have a healthy diet.   The benefits of regular aerobic exercise were discussed. The patient  exercises  3 to 5 days per week  for  60 minutes.    Depression screen: there are no signs or vegative symptoms of depression- irritability, change in appetite, anhedonia, sadness/tearfullness.   The following portions of the patient's history were reviewed and updated as appropriate: allergies, current medications, past family history, past medical history,  past surgical history, past social history  and problem list.   Visual acuity was not assessed per patient preference since the patient has regular follow up with an  ophthalmologist. Hearing and body mass index were assessed and  reviewed.    During the course of the visit the patient was educated and counseled about appropriate screening and preventive services including : fall prevention , diabetes screening, nutrition counseling, colorectal cancer screening, and recommended immunizations.    Chief Complaint:  Chase Carey has been lost to follow up.  Last visit Sept 2022   S/p Lap cholecystectomy  in May, done without complications at [facility]; discharged home the following day.   Since then has developed some unexpected food intolerances causing post prandial diarrhea  which lasted about 6 weeks.  Developed SBO in October caused by scar tissue from prior  hernia repair done in 2017 .  Op note from [place] reviewed:  volvulus detortion and lysis of adhesions.  Discharged home the following day   Has been on FMLA for 6 weeks, since his October surgery .  Returns to work on Friday  Elevated BP readings x 2; does not check BP    Most recent [facility] labs reviewed Low magnesium, low  hgb 12.8  lab done by bariatric nurse,  not addressed       Review of Symptoms  Patient denies headache, fevers, malaise, unintentional weight loss, skin rash, eye pain, sinus congestion and sinus pain, sore throat, dysphagia,  hemoptysis , cough, dyspnea, wheezing, chest pain, palpitations, orthopnea, edema, abdominal pain, nausea, melena, diarrhea, constipation, flank pain, dysuria, hematuria, urinary  Frequency, nocturia, numbness, tingling, seizures,  Focal weakness, Loss of consciousness,  Tremor, insomnia, depression, anxiety, and suicidal ideation.  Physical Exam:  BP 124/86   Pulse 77   Temp 97.6 F (36.4 C) (Oral)   Ht 6\' 4"  (1.93 m)   Wt 271 lb 12.8 oz (123.3 kg)   SpO2 99%   BMI 33.08 kg/m   General appearance: alert, cooperative and appears stated age Ears: normal TM's and external ear canals both ears Throat: lips, mucosa, and tongue normal; teeth and gums normal Neck: no adenopathy, no carotid bruit, supple, symmetrical,  trachea midline and thyroid not enlarged, symmetric, no tenderness/mass/nodules Back: symmetric, no curvature. ROM normal. No CVA tenderness. Lungs: clear to auscultation bilaterally Heart: regular rate and rhythm, S1, S2 normal, no murmur, click, rub or gallop Abdomen: soft, non-tender; bowel sounds normal; no masses,  no organomegaly Pulses: 2+ and symmetric Skin: Skin color, texture, turgor normal. No rashes or lesions Lymph nodes: Cervical, supraclavicular, and axillary nodes normal.    Assessment and Plan:  Increased frequency of urination Continue treatment for BPH with tamsulosin.  PSA pending   S/P gastric bypass After reaching a nadir of 232 lbs  In 2015,  He has regained 40 lbs and his weight is stable .  Body mass index is 33.08 kg/m.    have addressed  BMI and recommended wt loss of 10% of body weight over the next 6 months using a low glycemic index diet and regular exercise a minimum of 5 days per week.  Printed information for [name] given, and encouraged to resume support group meetings   S/P laparoscopic cholecystectomy May 2023 at [facility]   Anemia Persistent.  Likely secondary to surgical losses vs decreased absorption of iron due to bariatric surgery  however he has normal  iron,  b12 . Diagnostic EGD and colonoscopy jan 2021 at [facility] revealed no ulcers or masses.   Lab Results  Component Value Date   WBC 9.6 04/17/2022   HGB 11.8 (A) 04/17/2022   HCT 38.0 (L) 03/08/2021   MCV 95.5 03/08/2021   PLT 175 04/17/2022     B12 deficiency Maintaining normal levels with supplementation  Hypothyroidism (acquired) Thyroid function as underactive ow on 75 mcg his dose . He hasn Increasing dose to 88 mcg daily   Lab Results  Component Value Date   TSH 7.14 (H) 03/08/2021     BPH with obstruction/lower urinary tract symptoms Managed with tamsulosin   Encounter for preventive health examination age appropriate education and counseling  updated, referrals for preventative services and immunizations addressed, dietary and smoking counseling addressed, most recent labs reviewed.  I have personally reviewed and have noted:   1) the patient's medical and social history 2) The pt's use of alcohol, tobacco, and illicit drugs 3) The patient's current medications and supplements 4) Functional ability including ADL's, fall risk, home safety risk, hearing and visual impairment 5) Diet and physical activities 6) Evidence for depression or mood disorder 7) The patient's height, weight, and BMI have been recorded in the chart   I have made referrals, and provided counseling and education based on review of the above   Elevated blood pressure reading in office without diagnosis of hypertension He has no prior history of hypertension. He will check his blood pressure several times over the next 3-4 weeks and to submit readings for evaluation. Urine microalbumin to creatinine ratio done today is pending    Hypomagnesemia Secondary to prolonged diarrhea.  Likely resolved,  repeat level needed   Updated Medication List Outpatient Encounter Medications as of 05/27/2022  Medication Sig  calcium carbonate (OS-CAL) 600 MG TABS Take 600 mg by mouth 2 (two) times daily with a meal.   cholecalciferol (VITAMIN D) 1000 UNITS tablet Take 3,000 Units by mouth daily.    EPINEPHrine 0.3 mg/0.3 mL IJ SOAJ injection INJECT 0.3 MLS (0.3 MG TOTAL) INTO THE MUSCLE ONCE FOR 1 DOSE.   hydrocortisone (ANUSOL-HC) 25 MG suppository Place 1 suppository (25 mg total) rectally 2 (two) times daily.   levothyroxine (SYNTHROID) 88 MCG tablet TAKE 1 TABLET(88 MCG) BY MOUTH DAILY   metoprolol succinate (TOPROL-XL) 25 MG 24 hr tablet TAKE 1 TABLET(25 MG) BY MOUTH DAILY   Multiple Vitamin (MULTIVITAMINS PO) Take by mouth daily.   Multiple Vitamins-Minerals (ZINC PO) Take 1 tablet by mouth daily.   Probiotic Product (PROBIOTIC DAILY PO) Take by mouth daily.   vitamin  B-12 (CYANOCOBALAMIN) 100 MCG tablet Take 100 mcg by mouth daily.   [DISCONTINUED] ketoconazole (NIZORAL) 2 % shampoo SHAMPOO FACE, SCALP, AND EARS TOPICALLY TWICE A WEEK. LET LATHER SIT FOR 2 3 MINS. BEFORE RINSING.   [DISCONTINUED] tamsulosin (FLOMAX) 0.4 MG CAPS capsule TAKE 2 CAPSULES BY MOUTH EVERY DAY   ketoconazole (NIZORAL) 2 % shampoo SHAMPOO FACE, SCALP, AND EARS TOPICALLY TWICE A WEEK. LET LATHER SIT FOR 2 3 MINS. BEFORE RINSING.   tamsulosin (FLOMAX) 0.4 MG CAPS capsule Take 2 capsules (0.8 mg total) by mouth daily.   [DISCONTINUED] tiZANidine (ZANAFLEX) 4 MG tablet Take 1 tablet (4 mg total) by mouth every 6 (six) hours as needed for muscle spasms. (Patient not taking: Reported on 05/27/2022)   [DISCONTINUED] Vitamin D, Ergocalciferol, (DRISDOL) 1.25 MG (50000 UNIT) CAPS capsule TAKE 1 CAPSULE BY MOUTH 1 TIME A WEEK (Patient not taking: Reported on 05/27/2022)   No facility-administered encounter medications on file as of 05/27/2022.

## 2022-05-27 NOTE — Assessment & Plan Note (Signed)
Thyroid function as underactive ow on 75 mcg his dose . He hasn Increasing dose to 88 mcg daily   Lab Results  Component Value Date   TSH 7.14 (H) 03/08/2021

## 2022-05-27 NOTE — Patient Instructions (Signed)
Good to see you!  Rechecking your hemoglobin and magnesium today (both were low in October)  Will refill thyroid once level has been resulted today   YOur BP was elevated the last 2 office visits.  The new "normal"  for  blood pressure is now 120/70, and treatment is recommended for readings > 130/80   .  Please check your blood pressure a few times at home and send me the readings so I can determine if you need to start a medication to lower your blood pressure .

## 2022-05-27 NOTE — Assessment & Plan Note (Signed)
After reaching a nadir of 232 lbs  In 2015,  He has regained 40 lbs and his weight is stable .  Body mass index is 33.08 kg/m.    have addressed  BMI and recommended wt loss of 10% of body weight over the next 6 months using a low glycemic index diet and regular exercise a minimum of 5 days per week.  Printed information for Chase Carey given, and encouraged to resume support group meetings

## 2022-05-27 NOTE — Assessment & Plan Note (Signed)
He has no prior history of hypertension. He will check his blood pressure several times over the next 3-4 weeks and to submit readings for evaluation. Urine microalbumin to creatinine ratio done today is pending.  

## 2022-05-27 NOTE — Assessment & Plan Note (Signed)
Maintaining normal levels with supplementation

## 2022-05-27 NOTE — Assessment & Plan Note (Signed)
May 2023 at [facility]

## 2022-05-27 NOTE — Assessment & Plan Note (Signed)
Managed with tamsulosin  

## 2022-05-27 NOTE — Assessment & Plan Note (Addendum)
Persistent.  Likely secondary to surgical losses vs decreased absorption of iron due to bariatric surgery  however he has normal  iron,  b12 . Diagnostic EGD and colonoscopy jan 2021 at [facility] revealed no ulcers or masses.   Lab Results  Component Value Date   WBC 9.6 04/17/2022   HGB 11.8 (A) 04/17/2022   HCT 38.0 (L) 03/08/2021   MCV 95.5 03/08/2021   PLT 175 04/17/2022

## 2022-05-27 NOTE — Assessment & Plan Note (Signed)
Continue treatment for BPH with tamsulosin.  PSA pending

## 2022-05-27 NOTE — Assessment & Plan Note (Signed)
Secondary to prolonged diarrhea.  Likely resolved,  repeat level needed

## 2022-05-27 NOTE — Progress Notes (Deleted)
Subjective:  Patient ID: Chase Carey, male    DOB: 1975/11/22  Age: 46 y.o. MRN: [id number]  CC: The primary encounter diagnosis was Hypothyroidism (acquired). Diagnoses of Impaired fasting glucose, Dyslipidemia, Prostate cancer screening, Elevated blood pressure reading in office without diagnosis of hypertension, Fatigue, unspecified type, Hypomagnesemia, Anemia, normocytic normochromic, Increased frequency of urination, S/P gastric bypass, S/P laparoscopic cholecystectomy, Anemia, unspecified type, B12 deficiency, and BPH with obstruction/lower urinary tract symptoms were also pertinent to this visit.   HPI Chase Carey presents for  Chief Complaint  Patient presents with   Follow-up    Follow up for medication refills   Chase Carey has been lost to follow up.  Last visit Sept 2022   S/p Lap cholecystectomy  in May, done without complications at [facility]; discharged home the following day.   Since then has developed some unexpected food intolerances causing post prandial diarrhea  which lasted about 6 weeks.  Developed SBO in October caused by scar tissue from prior  hernia repair done in 2017 .  Op note from Chase Carey reviewed:  volvulus detortion and lysis of adhesions.  Discharged home the following day   Has been on FMLA for 6 weeks, since his October surgery .  Returns to work on Friday  Elevated BP readings x 2; does not check BP    Most recent [facility] labs reviewed Low magnesium, low  hgb 12.8  lab done by bariatric nurse,  not addressed    Outpatient Medications Prior to Visit  Medication Sig Dispense Refill   calcium carbonate (OS-CAL) 600 MG TABS Take 600 mg by mouth 2 (two) times daily with a meal.     cholecalciferol (VITAMIN D) 1000 UNITS tablet Take 3,000 Units by mouth daily.      EPINEPHrine 0.3 mg/0.3 mL IJ SOAJ injection INJECT 0.3 MLS (0.3 MG TOTAL) INTO THE MUSCLE ONCE FOR 1 DOSE.     hydrocortisone (ANUSOL-HC) 25 MG suppository Place 1 suppository (25 mg total) rectally  2 (two) times daily. 12 suppository 5   levothyroxine (SYNTHROID) 88 MCG tablet TAKE 1 TABLET(88 MCG) BY MOUTH DAILY 90 tablet 1   metoprolol succinate (TOPROL-XL) 25 MG 24 hr tablet TAKE 1 TABLET(25 MG) BY MOUTH DAILY 90 tablet 3   Multiple Vitamin (MULTIVITAMINS PO) Take by mouth daily.     Multiple Vitamins-Minerals (ZINC PO) Take 1 tablet by mouth daily.     Probiotic Product (PROBIOTIC DAILY PO) Take by mouth daily.     vitamin B-12 (CYANOCOBALAMIN) 100 MCG tablet Take 100 mcg by mouth daily.     ketoconazole (NIZORAL) 2 % shampoo SHAMPOO FACE, SCALP, AND EARS TOPICALLY TWICE A WEEK. LET LATHER SIT FOR 2 3 MINS. BEFORE RINSING.     tamsulosin (FLOMAX) 0.4 MG CAPS capsule TAKE 2 CAPSULES BY MOUTH EVERY DAY 60 capsule 5   tiZANidine (ZANAFLEX) 4 MG tablet Take 1 tablet (4 mg total) by mouth every 6 (six) hours as needed for muscle spasms. (Patient not taking: Reported on 05/27/2022) 30 tablet 0   Vitamin D, Ergocalciferol, (DRISDOL) 1.25 MG (50000 UNIT) CAPS capsule TAKE 1 CAPSULE BY MOUTH 1 TIME A WEEK (Patient not taking: Reported on 05/27/2022) 4 capsule 3   No facility-administered medications prior to visit.    Review of Systems;  Patient denies headache, fevers, malaise, unintentional weight loss, skin rash, eye pain, sinus congestion and sinus pain, sore throat, dysphagia,  hemoptysis , cough, dyspnea, wheezing, chest pain, palpitations, orthopnea, edema, abdominal pain, nausea, melena, diarrhea,  constipation, flank pain, dysuria, hematuria, urinary  Frequency, nocturia, numbness, tingling, seizures,  Focal weakness, Loss of consciousness,  Tremor, insomnia, depression, anxiety, and suicidal ideation.      Objective:  BP 124/86   Pulse 77   Temp 97.6 F (36.4 C) (Oral)   Ht 6' 4" (1.93 m)   Wt 271 lb 12.8 oz (123.3 kg)   SpO2 99%   BMI 33.08 kg/m   BP Readings from Last 3 Encounters:  05/27/22 124/86  03/08/21 114/72  02/03/20 108/78    Wt Readings from Last 3  Encounters:  05/27/22 271 lb 12.8 oz (123.3 kg)  03/08/21 273 lb 6.4 oz (124 kg)  05/07/20 283 lb (128.4 kg)    General appearance: alert, cooperative and appears stated age Ears: normal TM's and external ear canals both ears Throat: lips, mucosa, and tongue normal; teeth and gums normal Neck: no adenopathy, no carotid bruit, supple, symmetrical, trachea midline and thyroid not enlarged, symmetric, no tenderness/mass/nodules Back: symmetric, no curvature. ROM normal. No CVA tenderness. Lungs: clear to auscultation bilaterally Heart: regular rate and rhythm, S1, S2 normal, no murmur, click, rub or gallop Abdomen: soft, non-tender; bowel sounds normal; no masses,  no organomegaly Pulses: 2+ and symmetric Skin: Skin color, texture, turgor normal. No rashes or lesions Lymph nodes: Cervical, supraclavicular, and axillary nodes normal. Neuro:  awake and interactive with normal mood and affect. Higher cortical functions are normal. Speech is clear without word-finding difficulty or dysarthria. Extraocular movements are intact. Visual fields of both eyes are grossly intact. Sensation to light touch is grossly intact bilaterally of upper and lower extremities. Motor examination shows 4+/5 symmetric hand grip and upper extremity and 5/5 lower extremity strength. There is no pronation or drift. Gait is non-ataxic   Lab Results  Component Value Date   HGBA1C 5.0 03/08/2021   HGBA1C 5.2 07/01/2017   HGBA1C 5.1 03/31/2016    Lab Results  Component Value Date   CREATININE 0.7 04/17/2022   CREATININE 0.76 03/08/2021   CREATININE 0.81 08/31/2019    Lab Results  Component Value Date   WBC 9.6 04/17/2022   HGB 11.8 (A) 04/17/2022   HCT 38.0 (L) 03/08/2021   PLT 175 04/17/2022   GLUCOSE 102 (H) 03/08/2021   CHOL 198 03/08/2021   TRIG 206 (H) 03/08/2021   HDL 63 03/08/2021   LDLDIRECT 117.0 08/31/2019   LDLCALC 103 (H) 03/08/2021   ALT 17 04/24/2022   ALT 19 04/24/2022   AST 33 04/24/2022    NA 137 03/08/2021   K 4.4 03/08/2021   CL 101 03/08/2021   CREATININE 0.7 04/17/2022   BUN 8 04/17/2022   CO2 28 03/08/2021   TSH 7.14 (H) 03/08/2021   PSA 0.44 08/31/2019   HGBA1C 5.0 03/08/2021   MICROALBUR <0.7 08/31/2019    No results found.  Assessment & Plan:   Problem List Items Addressed This Visit     Anemia    Persistent.  Likely secondary to surgical losses vs decreased absorption of iron due to bariatric surgery  however he has normal  iron,  b12 . Diagnostic EGD and colonoscopy jan 2021 at [facility] revealed no ulcers or masses.   Lab Results  Component Value Date   WBC 9.6 04/17/2022   HGB 11.8 (A) 04/17/2022   HCT 38.0 (L) 03/08/2021   MCV 95.5 03/08/2021   PLT 175 04/17/2022        B12 deficiency    Maintaining normal levels with supplementation  BPH with obstruction/lower urinary tract symptoms    Managed with tamsulosin       Relevant Medications   tamsulosin (FLOMAX) 0.4 MG CAPS capsule   Dyslipidemia   Relevant Orders   Lipid Profile   Direct LDL   Fatigue   Hypothyroidism (acquired) - Primary    Thyroid function as underactive ow on 75 mcg his dose . He hasn Increasing dose to 88 mcg daily   Lab Results  Component Value Date   TSH 7.14 (H) 03/08/2021        Relevant Orders   TSH   Impaired fasting glucose   Relevant Orders   Comp Met (CMET)   HgB A1c   Increased frequency of urination    Continue treatment for BPH with tamsulosin.  PSA pending       Prostate cancer screening   Relevant Orders   PSA   S/P gastric bypass    After reaching a nadir of 232 lbs  In 2015,  He has regained 40 lbs and his weight is stable .  Body mass index is 33.08 kg/m.    have addressed  BMI and recommended wt loss of 10% of body weight over the next 6 months using a low glycemic index diet and regular exercise a minimum of 5 days per week.  Printed information for overeater's anonymous meetings given, and encouraged to resume support group  meetings       S/P laparoscopic cholecystectomy    May 2023 at [facility]       Other Visit Diagnoses     Elevated blood pressure reading in office without diagnosis of hypertension       Relevant Orders   Microalbumin / creatinine urine ratio   Hypomagnesemia       Relevant Orders   Magnesium   Anemia, normocytic normochromic       Relevant Orders   CBC with Differential/Platelet       I spent a total of  30 minutes with this patient in a face to face visit on the date of this encounter reviewing the last office visit with me in   2022    ,  most recent visit with  General surgery , patient's diet and exercise habits,  recent labs and imaging studies, and post visit ordering of testing and therapeutics.    Follow-up: No follow-ups on file.    L , MD 

## 2022-06-09 ENCOUNTER — Encounter: Payer: Self-pay | Admitting: Internal Medicine

## 2022-06-09 DIAGNOSIS — I83893 Varicose veins of bilateral lower extremities with other complications: Secondary | ICD-10-CM

## 2022-06-24 ENCOUNTER — Other Ambulatory Visit (INDEPENDENT_AMBULATORY_CARE_PROVIDER_SITE_OTHER): Payer: Self-pay | Admitting: Nurse Practitioner

## 2022-06-24 DIAGNOSIS — I83893 Varicose veins of bilateral lower extremities with other complications: Secondary | ICD-10-CM

## 2022-06-27 ENCOUNTER — Ambulatory Visit (INDEPENDENT_AMBULATORY_CARE_PROVIDER_SITE_OTHER): Payer: Managed Care, Other (non HMO) | Admitting: Nurse Practitioner

## 2022-06-27 ENCOUNTER — Encounter (INDEPENDENT_AMBULATORY_CARE_PROVIDER_SITE_OTHER): Payer: Self-pay | Admitting: Nurse Practitioner

## 2022-06-27 ENCOUNTER — Ambulatory Visit (INDEPENDENT_AMBULATORY_CARE_PROVIDER_SITE_OTHER): Payer: Managed Care, Other (non HMO)

## 2022-06-27 VITALS — BP 131/84 | HR 82 | Resp 16 | Ht 76.0 in | Wt 273.0 lb

## 2022-06-27 DIAGNOSIS — I83893 Varicose veins of bilateral lower extremities with other complications: Secondary | ICD-10-CM | POA: Diagnosis not present

## 2022-06-27 DIAGNOSIS — I83891 Varicose veins of right lower extremities with other complications: Secondary | ICD-10-CM

## 2022-06-27 NOTE — Progress Notes (Signed)
Subjective:    Patient ID: Chase Carey, male    DOB: Mar 01, 1976, 47 y.o.   MRN: [id number] Chief Complaint  Patient presents with  . Follow-up    ultrasound    Chase Carey is a 47 year old male that is seen for evaluation of symptomatic varicose veins. The patient relates burning and stinging which worsened steadily throughout the course of the day, particularly with standing. The patient also notes an aching and throbbing pain over the varicosities, particularly with prolonged dependent positions. The symptoms are significantly improved with elevation.  The patient also notes that during hot weather the symptoms are greatly intensified. The patient states the pain from the varicose veins interferes with work, daily exercise, shopping and household maintenance. At this point, the symptoms are persistent and severe enough that they're having a negative impact on lifestyle and are interfering with daily activities.  There is no history of DVT, PE or superficial thrombophlebitis. There is no history of ulceration or hemorrhage. The patient denies a significant family history of varicose veins.  The patient has not worn graduated compression in the past. At the present time the patient has not been using over-the-counter analgesics. There is no history of prior surgical intervention or sclerotherapy.     Review of Systems  Skin:  Positive for wound.  Hematological:  Bruises/bleeds easily.  All other systems reviewed and are negative.      Objective:   Physical Exam Vitals reviewed.  HENT:     Head: Normocephalic.  Cardiovascular:     Rate and Rhythm: Normal rate.     Pulses: Normal pulses.  Pulmonary:     Effort: Pulmonary effort is normal.  Musculoskeletal:     Right lower leg: Edema present.  Skin:    General: Skin is warm and dry.  Neurological:     Mental Status: He is alert and oriented to person, place, and time.  Psychiatric:        Mood and Affect: Mood normal.         Behavior: Behavior normal.        Thought Content: Thought content normal.        Judgment: Judgment normal.    BP 131/84 (BP Location: Left Arm)   Pulse 82   Resp 16   Ht 6\' 4"  (1.93 m)   Wt 273 lb (123.8 kg)   BMI 33.23 kg/m   Past Medical History:  Diagnosis Date  . Anal fissure   . Anemia, unspecified   . Arthritis    ankle  . Asymptomatic varicose veins   . Balanoposthitis   . Cervicalgia   . Chronic headaches   . Elevated blood pressure reading without diagnosis of hypertension   . Gallstones   . GERD (gastroesophageal reflux disease)   . Localized superficial swelling, mass, or lump   . Lumbago   . Obesity, unspecified   . Other abnormal blood chemistry   . Other dyspnea and respiratory abnormality   . Plantar fascial fibromatosis   . Psychogenic pain, site unspecified   . Seborrhea capitis   . Sleep apnea    mild  . Sprain of neck   . Tuberculosis    positive PPD; negative chest x ray- 2000, took medication for 6 months    Social History   Socioeconomic History  . Marital status: Married    Spouse name: Not on file  . Number of children: 2  . Years of education: college  . Highest education level: Not  on file  Occupational History  . Occupation: [employer]  Tobacco Use  . Smoking status: Never  . Smokeless tobacco: Never  Substance and Sexual Activity  . Alcohol use: No    Alcohol/week: 0.0 standard drinks of alcohol    Comment: occasional  1 - 2 times per year wine  . Drug use: No  . Sexual activity: Not on file  Other Topics Concern  . Not on file  Social History Narrative   Unknown family history. Adopted. Married x 10.5 years, happily married. Caffeine use: Coffee, 2 servings per day. Guns in the home stored in locked cabinet, loaded. No formal exercise program.   Social Determinants of Health   Financial Resource Strain: Not on file  Food Insecurity: Not on file  Transportation Needs: Not on file  Physical Activity:  Not on file  Stress: Not on file  Social Connections: Not on file  Intimate Partner Violence: Not on file    Past Surgical History:  Procedure Laterality Date  . GASTRIC BYPASS  10/17/2009  . [place]  2011  . HERNIA REPAIR  03/2016   internal at [facility]  . RLE varicose vein laser procedure  2011   [facility]  . ROTATOR CUFF REPAIR Right 09/2020  . VASECTOMY  02/25/2013   no report of complications  . VEIN LIGATION AND STRIPPING Right 04/07/2013   leg    Family History  Adopted: Yes  Problem Relation Age of Onset  . Other Other        adopted    Allergies  Allergen Reactions  . Shrimp Extract Allergy Skin Test Anaphylaxis    Shortness of breath. Shortness of breath. Shortness of breath.   . Shrimp [Shellfish Allergy] Anaphylaxis    Shortness of breath. Shortness of breath.  . Nsaids Other (See Comments)    History of RYGB. Ulcergenic       Latest Ref Rng & Units 05/27/2022   12:05 PM 04/17/2022   12:00 AM 03/08/2021    2:40 PM  CBC  WBC 4.0 - 10.5 K/uL 6.6  9.6     6.1   Hemoglobin 13.0 - 17.0 g/dL 12.4  11.8     12.3   Hematocrit 39.0 - 52.0 % 37.6   38.0   Platelets 150.0 - 400.0 K/uL 214.0  175     223      This result is from an external source.      CMP     Component Value Date/Time   NA 138 05/27/2022 1205   K 4.0 05/27/2022 1205   CL 100 05/27/2022 1205   CO2 30 05/27/2022 1205   GLUCOSE 73 05/27/2022 1205   BUN 13 05/27/2022 1205   BUN 8 04/17/2022 0000   CREATININE 0.70 05/27/2022 1205   CREATININE 0.76 03/08/2021 1440   CALCIUM 8.8 05/27/2022 1205   PROT 7.0 05/27/2022 1205   ALBUMIN 4.3 05/27/2022 1205   AST 26 05/27/2022 1205   ALT 18 05/27/2022 1205   ALKPHOS 78 05/27/2022 1205   BILITOT 0.6 05/27/2022 1205   GFRNONAA >89 09/24/2012 0828   GFRAA >89 09/24/2012 0828     No results found.     Assessment & Plan:   1. Hemorrhage of varicose veins of lower extremity, right Recommend  I have reviewed  my previous  discussion with the patient regarding  varicose veins and why they cause symptoms. Patient will continue  wearing graduated compression stockings class 1 on a daily basis,  beginning first thing in the morning and removing them in the evening.  The patient is CEAP [id number].  The patient has been wearing compression for more than 12 weeks with no or little benefit.  The patient has been exercising daily for more than 12 weeks. The patient has been elevating and taking OTC pain medications for more than 12 weeks.  None of these have have eliminated the pain related to the varicose veins and venous reflux or the discomfort regarding venous congestion.    In addition, behavioral modification including elevation during the day was again discussed and this will continue.  The patient has utilized over the counter pain medications and has been exercising.  However, at this time conservative therapy has not alleviated the patient's symptoms of leg pain and swelling  Recommend: laser ablation of the right great saphenous veins to eliminate the symptoms of pain and swelling of the lower extremities as well as to prevent further hemorrhagic episodes of the varicosities, caused by severe superficial venous reflux.   Current Outpatient Medications on File Prior to Visit  Medication Sig Dispense Refill  . calcium carbonate (OS-CAL) 600 MG TABS Take 600 mg by mouth 2 (two) times daily with a meal.    . cholecalciferol (VITAMIN D) 1000 UNITS tablet Take 3,000 Units by mouth daily.     Marland Kitchen EPINEPHrine 0.3 mg/0.3 mL IJ SOAJ injection INJECT 0.3 MLS (0.3 MG TOTAL) INTO THE MUSCLE ONCE FOR 1 DOSE.    . hydrocortisone (ANUSOL-HC) 25 MG suppository Place 1 suppository (25 mg total) rectally 2 (two) times daily. 12 suppository 5  . ketoconazole (NIZORAL) 2 % shampoo SHAMPOO FACE, SCALP, AND EARS TOPICALLY TWICE A WEEK. LET LATHER SIT FOR 2 3 MINS. BEFORE RINSING. 120 mL 2  . levothyroxine (SYNTHROID) 88 MCG  tablet TAKE 1 TABLET(88 MCG) BY MOUTH DAILY 90 tablet 1  . metoprolol succinate (TOPROL-XL) 25 MG 24 hr tablet TAKE 1 TABLET(25 MG) BY MOUTH DAILY 90 tablet 3  . Multiple Vitamin (MULTIVITAMINS PO) Take by mouth daily.    . Multiple Vitamins-Minerals (ZINC PO) Take 1 tablet by mouth daily.    . Probiotic Product (PROBIOTIC DAILY PO) Take by mouth daily.    . tamsulosin (FLOMAX) 0.4 MG CAPS capsule Take 2 capsules (0.8 mg total) by mouth daily. 60 capsule 5  . vitamin B-12 (CYANOCOBALAMIN) 100 MCG tablet Take 100 mcg by mouth daily.     No current facility-administered medications on file prior to visit.    There are no Patient Instructions on file for this visit. No follow-ups on file.   Georgiana Spinner, NP

## 2022-07-28 ENCOUNTER — Encounter (INDEPENDENT_AMBULATORY_CARE_PROVIDER_SITE_OTHER): Payer: Self-pay

## 2022-07-28 NOTE — Telephone Encounter (Signed)
Spoke with pt and advised the status of back ordered supplies needed for the laser ablation procedure. I advised that I think we are making progress and I would hopefully call him soon. I did explain the back log of patients from October, November and December that would ned to come in first. Pt acknowledged.

## 2022-07-28 NOTE — Telephone Encounter (Signed)
Patient checking the status 

## 2022-12-10 ENCOUNTER — Ambulatory Visit: Payer: Managed Care, Other (non HMO) | Admitting: Podiatry

## 2022-12-10 DIAGNOSIS — M7751 Other enthesopathy of right foot: Secondary | ICD-10-CM

## 2022-12-24 ENCOUNTER — Ambulatory Visit: Payer: Managed Care, Other (non HMO) | Admitting: Podiatry

## 2022-12-26 ENCOUNTER — Other Ambulatory Visit: Payer: Self-pay | Admitting: Internal Medicine

## 2023-01-18 ENCOUNTER — Other Ambulatory Visit: Payer: Self-pay | Admitting: Internal Medicine

## 2023-02-03 ENCOUNTER — Ambulatory Visit (INDEPENDENT_AMBULATORY_CARE_PROVIDER_SITE_OTHER): Payer: Managed Care, Other (non HMO) | Admitting: Vascular Surgery

## 2023-02-03 VITALS — BP 114/75 | HR 97 | Resp 17 | Ht 76.0 in | Wt 267.8 lb

## 2023-02-03 DIAGNOSIS — I83811 Varicose veins of right lower extremities with pain: Secondary | ICD-10-CM | POA: Diagnosis not present

## 2023-02-03 DIAGNOSIS — I83819 Varicose veins of unspecified lower extremities with pain: Secondary | ICD-10-CM

## 2023-02-03 NOTE — Progress Notes (Signed)
Chase Carey is a 47 y.o. male who presents with symptomatic venous reflux  Past Medical History:  Diagnosis Date   Anal fissure    Anemia, unspecified    Arthritis    ankle   Asymptomatic varicose veins    Balanoposthitis    Cervicalgia    Chronic headaches    Elevated blood pressure reading without diagnosis of hypertension    Gallstones    GERD (gastroesophageal reflux disease)    Localized superficial swelling, mass, or lump    Lumbago    Obesity, unspecified    Other abnormal blood chemistry    Other dyspnea and respiratory abnormality    Plantar fascial fibromatosis    Psychogenic pain, site unspecified    Seborrhea capitis    Sleep apnea    mild   Sprain of neck    Tuberculosis    positive PPD; negative chest x ray- 2000, took medication for 6 months    Past Surgical History:  Procedure Laterality Date   GASTRIC BYPASS  10/17/2009   HEMORRHOIDECTOMY WITH HEMORRHOID BANDING  2011   HERNIA REPAIR  03/2016   internal at [facility]   RLE varicose vein laser procedure  2011   [facility]   ROTATOR CUFF REPAIR Right 09/2020   VASECTOMY  02/25/2013   no report of complications   VEIN LIGATION AND STRIPPING Right 04/07/2013   leg     Current Outpatient Medications:    calcium carbonate (OS-CAL) 600 MG TABS, Take 600 mg by mouth 2 (two) times daily with a meal., Disp: , Rfl:    cholecalciferol (VITAMIN D) 1000 UNITS tablet, Take 3,000 Units by mouth daily. , Disp: , Rfl:    EPINEPHrine 0.3 mg/0.3 mL IJ SOAJ injection, INJECT 0.3 MLS (0.3 MG TOTAL) INTO THE MUSCLE ONCE FOR 1 DOSE., Disp: , Rfl:    hydrocortisone (ANUSOL-HC) 25 MG suppository, Place 1 suppository (25 mg total) rectally 2 (two) times daily., Disp: 12 suppository, Rfl: 5   ketoconazole (NIZORAL) 2 % shampoo, SHAMPOO FACE, SCALP, AND EARS TOPICALLY TWICE A WEEK. LET LATHER SIT FOR 2 3 MINS. BEFORE RINSING., Disp: 120 mL, Rfl: 2   levothyroxine (SYNTHROID) 88 MCG tablet, TAKE 1 TABLET(88 MCG) BY MOUTH DAILY, Disp:  90 tablet, Rfl: 1   metoprolol succinate (TOPROL-XL) 25 MG 24 hr tablet, TAKE 1 TABLET(25 MG) BY MOUTH DAILY, Disp: 90 tablet, Rfl: 3   Multiple Vitamin (MULTIVITAMINS PO), Take by mouth daily., Disp: , Rfl:    Multiple Vitamins-Minerals (ZINC PO), Take 1 tablet by mouth daily., Disp: , Rfl:    Probiotic Product (PROBIOTIC DAILY PO), Take by mouth daily., Disp: , Rfl:    tamsulosin (FLOMAX) 0.4 MG CAPS capsule, TAKE 2 CAPSULES(0.8 MG) BY MOUTH DAILY, Disp: 60 capsule, Rfl: 5   vitamin B-12 (CYANOCOBALAMIN) 100 MCG tablet, Take 100 mcg by mouth daily., Disp: , Rfl:   Allergies  Allergen Reactions   Shrimp Extract Anaphylaxis    Shortness of breath. Shortness of breath. Shortness of breath.    Shrimp [Shellfish Allergy] Anaphylaxis    Shortness of breath. Shortness of breath.   Nsaids Other (See Comments)    History of RYGB. Ulcergenic     Varicose veins with pain     PLAN: The patient's right lower extremity was sterilely prepped and draped. The ultrasound machine was used to visualize the saphenous vein throughout its course.  The saphenous vein became patent again in the mid thigh being fed by a large superficial branches.  Distal  to the mid thigh, it appeared occluded from previous ablation.  A segment in the mid thigh was selected for access. The saphenous vein was accessed without difficulty using ultrasound guidance with a micropuncture needle. A 0.018 wire was then placed beyond the saphenofemoral junction and the needle was removed. The 65 cm sheath was then placed over the wire and the wire and dilator were removed. The laser fiber was then placed through the sheath and its tip was placed approximately 5 centimeters below the saphenofemoral junction. Tumescent anesthesia was then created with a dilute lidocaine solution. Laser energy was then delivered with constant withdrawal of the sheath and laser fiber. Approximately 726 joules of energy were delivered over a length of 17  centimeters using a 1470 Hz VenaCure machine at 7 W. Sterile dressings were placed. The patient tolerated the procedure well without obvious complications.   Follow-up in 1 week with post-laser duplex.

## 2023-02-05 ENCOUNTER — Encounter (INDEPENDENT_AMBULATORY_CARE_PROVIDER_SITE_OTHER): Payer: Self-pay

## 2023-02-10 ENCOUNTER — Ambulatory Visit (INDEPENDENT_AMBULATORY_CARE_PROVIDER_SITE_OTHER): Payer: Managed Care, Other (non HMO)

## 2023-02-10 DIAGNOSIS — I83811 Varicose veins of right lower extremities with pain: Secondary | ICD-10-CM

## 2023-02-10 DIAGNOSIS — I83819 Varicose veins of unspecified lower extremities with pain: Secondary | ICD-10-CM

## 2023-03-04 ENCOUNTER — Ambulatory Visit (INDEPENDENT_AMBULATORY_CARE_PROVIDER_SITE_OTHER): Payer: Managed Care, Other (non HMO) | Admitting: Nurse Practitioner

## 2023-03-04 ENCOUNTER — Encounter (INDEPENDENT_AMBULATORY_CARE_PROVIDER_SITE_OTHER): Payer: Self-pay | Admitting: Nurse Practitioner

## 2023-03-04 VITALS — BP 122/85 | HR 82 | Resp 16 | Wt 274.4 lb

## 2023-03-04 DIAGNOSIS — I83819 Varicose veins of unspecified lower extremities with pain: Secondary | ICD-10-CM | POA: Diagnosis not present

## 2023-03-04 NOTE — Progress Notes (Signed)
Subjective:    Patient ID: Chase Carey, male    DOB: May 14, 1976, 47 y.o.   MRN: [id number] Chief Complaint  Patient presents with   Follow-up    4 week post laser     The patient returns to the office for followup status post laser ablation of the right saphenous vein on 02/03/2023.  The patient note significant improvement in the lower extremity pain but not resolution of the symptoms. The patient notes multiple residual varicosities bilaterally which continued to hurt with dependent positions and remained tender to palpation. The patient's swelling is minimally from preoperative status. The patient continues to wear graduated compression stockings on a daily basis but these are not eliminating the pain and discomfort. The patient continues to use over-the-counter anti-inflammatory medications to treat the pain and related symptoms but this has not given the patient relief. The patient notes the pain in the lower extremities is causing problems with daily exercise, problems at work and even with household activities such as preparing meals and doing dishes.  The patient is otherwise done well and there have been no complications related to the laser procedure or interval changes in the patient's overall   Post laser ultrasound shows successful ablation of the right GSV      Review of Systems  Cardiovascular:  Positive for leg swelling.  All other systems reviewed and are negative.      Objective:   Physical Exam Vitals reviewed.  HENT:     Head: Normocephalic.  Cardiovascular:     Rate and Rhythm: Normal rate.  Pulmonary:     Effort: Pulmonary effort is normal.  Musculoskeletal:        General: Tenderness present.  Skin:    General: Skin is warm and dry.     Comments: Large varicosities remaining in inner and distal thigh  Neurological:     Mental Status: He is alert and oriented to person, place, and time.  Psychiatric:        Mood and Affect: Mood normal.         Behavior: Behavior normal.        Thought Content: Thought content normal.        Judgment: Judgment normal.     BP 122/85 (BP Location: Left Arm)   Pulse 82   Resp 16   Wt 274 lb 6.4 oz (124.5 kg)   BMI 33.40 kg/m   Past Medical History:  Diagnosis Date   Anal fissure    Anemia, unspecified    Arthritis    ankle   Asymptomatic varicose veins    Balanoposthitis    Cervicalgia    Chronic headaches    Elevated blood pressure reading without diagnosis of hypertension    Gallstones    GERD (gastroesophageal reflux disease)    Localized superficial swelling, mass, or lump    Lumbago    Obesity, unspecified    Other abnormal blood chemistry    Other dyspnea and respiratory abnormality    Plantar fascial fibromatosis    Psychogenic pain, site unspecified    Seborrhea capitis    Sleep apnea    mild   Sprain of neck    Tuberculosis    positive PPD; negative chest x ray- 2000, took medication for 6 months    Social History   Socioeconomic History   Marital status: Married    Spouse name: Not on file   Number of children: 2   Years of education: college   Highest  education level: Not on file  Occupational History   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 Adopted. Married x 10.5 years, happily married. Caffeine use: Coffee, 2 servings per day. Guns in the home stored in locked cabinet, loaded. No formal exercise program.   Social Determinants of Health   Financial Resource Strain: Low Risk  (04/16/2022)   Received from [facility], [facility]   Overall Financial Resource Strain (CARDIA)    Difficulty of Paying Living  Expenses: Not hard at all  Food Insecurity: No Food Insecurity (04/16/2022)   Received from [facility], [facility]   Hunger Vital Sign    Worried About Running Out of Food in the Last Year: Never true    Ran Out of Food in the Last Year: Never true  Transportation Needs: No Transportation Needs (04/16/2022)   Received from [facility], [facility]   [facility]    Lack of Transportation (Medical): No    Lack of Transportation (Non-Medical): No  Physical Activity: Not on file  Stress: Not on file  Social Connections: Not on file  Intimate Partner Violence: Not on file    Past Surgical History:  Procedure Laterality Date   GASTRIC BYPASS  10/17/2009   HEMORRHOIDECTOMY WITH HEMORRHOID BANDING  2011   HERNIA REPAIR  03/2016   internal at [facility]   RLE varicose vein laser procedure  2011   [facility]   ROTATOR CUFF REPAIR Right 09/2020   VASECTOMY  02/25/2013   no report of complications   VEIN LIGATION AND STRIPPING Right 04/07/2013   leg    Family History  Adopted: Yes  Problem Relation Age of Onset   Other Other        adopted    Allergies  Allergen Reactions   Shrimp Extract Anaphylaxis    Shortness of breath. Shortness of breath. Shortness of breath.    Shrimp [Shellfish Allergy] Anaphylaxis    Shortness of breath. Shortness of breath.   Nsaids Other (See Comments)    History of RYGB. Ulcergenic       Latest Ref Rng & Units 05/27/2022   12:05 PM 04/17/2022   12:00 AM 03/08/2021    2:40 PM  CBC  WBC 4.0 - 10.5 K/uL 6.6  9.6     6.1   Hemoglobin 13.0 - 17.0 g/dL 19.1  47.8     29.5   Hematocrit 39.0 - 52.0 % 37.6   38.0   Platelets 150.0 - 400.0 K/uL 214.0  175     223      This result is from an external source.      CMP     Component Value Date/Time   NA 138 05/27/2022 1205   K 4.0 05/27/2022 1205   CL 100 05/27/2022 1205   CO2 30 05/27/2022 1205   GLUCOSE 73 05/27/2022 1205   BUN 13 05/27/2022 1205   BUN 8 04/17/2022  0000   CREATININE 0.70 05/27/2022 1205   CREATININE 0.76 03/08/2021 1440   CALCIUM 8.8 05/27/2022 1205  PROT 7.0 05/27/2022 1205   ALBUMIN 4.3 05/27/2022 1205   AST 26 05/27/2022 1205   ALT 18 05/27/2022 1205   ALKPHOS 78 05/27/2022 1205   BILITOT 0.6 05/27/2022 1205   GFR 109.11 05/27/2022 1205   GFRNONAA >89 09/24/2012 0828     No results found.     Assessment & Plan:   1. Varicose veins with pain Recommend:  The patient has had successful ablation of the previously incompetent saphenous venous system but still has persistent symptoms of pain and swelling that are having a negative impact on daily life and daily activities.  Patient should undergo injection foam sclerotherapy to treat the residual varicosities.  The risks, benefits and alternative therapies were reviewed in detail with the patient.  All questions were answered.  The patient agrees to proceed with foam sclerotherapy at their convenience.  The patient will continue wearing the graduated compression stockings and using the over-the-counter pain medications to treat her symptoms.       Current Outpatient Medications on File Prior to Visit  Medication Sig Dispense Refill   calcium carbonate (OS-CAL) 600 MG TABS Take 600 mg by mouth 2 (two) times daily with a meal.     cholecalciferol (VITAMIN D) 1000 UNITS tablet Take 3,000 Units by mouth daily.      EPINEPHrine 0.3 mg/0.3 mL IJ SOAJ injection INJECT 0.3 MLS (0.3 MG TOTAL) INTO THE MUSCLE ONCE FOR 1 DOSE.     hydrocortisone (ANUSOL-HC) 25 MG suppository Place 1 suppository (25 mg total) rectally 2 (two) times daily. 12 suppository 5   ketoconazole (NIZORAL) 2 % shampoo SHAMPOO FACE, SCALP, AND EARS TOPICALLY TWICE A WEEK. LET LATHER SIT FOR 2 3 MINS. BEFORE RINSING. 120 mL 2   levothyroxine (SYNTHROID) 88 MCG tablet TAKE 1 TABLET(88 MCG) BY MOUTH DAILY 90 tablet 1   metoprolol succinate (TOPROL-XL) 25 MG 24 hr tablet TAKE 1 TABLET(25 MG) BY MOUTH DAILY 90  tablet 3   Multiple Vitamin (MULTIVITAMINS PO) Take by mouth daily.     Multiple Vitamins-Minerals (ZINC PO) Take 1 tablet by mouth daily.     Probiotic Product (PROBIOTIC DAILY PO) Take by mouth daily.     tamsulosin (FLOMAX) 0.4 MG CAPS capsule TAKE 2 CAPSULES(0.8 MG) BY MOUTH DAILY 60 capsule 5   vitamin B-12 (CYANOCOBALAMIN) 100 MCG tablet Take 100 mcg by mouth daily.     No current facility-administered medications on file prior to visit.    There are no Patient Instructions on file for this visit. No follow-ups on file.   Georgiana Spinner, NP

## 2023-03-20 ENCOUNTER — Ambulatory Visit: Payer: Managed Care, Other (non HMO) | Admitting: Internal Medicine

## 2023-03-20 VITALS — BP 118/84 | HR 91 | Ht 76.0 in | Wt 272.0 lb

## 2023-03-20 DIAGNOSIS — E785 Hyperlipidemia, unspecified: Secondary | ICD-10-CM | POA: Diagnosis not present

## 2023-03-20 DIAGNOSIS — R7301 Impaired fasting glucose: Secondary | ICD-10-CM | POA: Diagnosis not present

## 2023-03-20 DIAGNOSIS — Z23 Encounter for immunization: Secondary | ICD-10-CM

## 2023-03-20 DIAGNOSIS — E039 Hypothyroidism, unspecified: Secondary | ICD-10-CM | POA: Diagnosis not present

## 2023-03-20 DIAGNOSIS — F330 Major depressive disorder, recurrent, mild: Secondary | ICD-10-CM

## 2023-03-20 DIAGNOSIS — E559 Vitamin D deficiency, unspecified: Secondary | ICD-10-CM

## 2023-03-20 DIAGNOSIS — E538 Deficiency of other specified B group vitamins: Secondary | ICD-10-CM

## 2023-03-20 DIAGNOSIS — K9089 Other intestinal malabsorption: Secondary | ICD-10-CM

## 2023-03-20 DIAGNOSIS — Z125 Encounter for screening for malignant neoplasm of prostate: Secondary | ICD-10-CM

## 2023-03-20 DIAGNOSIS — F320 Major depressive disorder, single episode, mild: Secondary | ICD-10-CM

## 2023-03-20 MED ORDER — BUPROPION HCL ER (XL) 150 MG PO TB24: 150.0000 mg | ORAL_TABLET | Freq: Every day | ORAL | 2 refills | Status: DC

## 2023-03-20 MED ORDER — COLESTIPOL HCL 5 G PO GRAN: 5.0000 g | GRANULES | Freq: Two times a day (BID) | ORAL | 12 refills | Status: AC

## 2023-03-20 NOTE — Progress Notes (Unsigned)
Subjective:  Patient ID: Chase Carey, male    DOB: 1975/11/17  Age: 47 y.o. MRN: [id number]  CC: The primary encounter diagnosis was Hypothyroidism (acquired). Diagnoses of Impaired fasting glucose, Dyslipidemia, and Need for influenza vaccination were also pertinent to this visit.   HPI Chase Carey presents for  Chief Complaint  Patient presents with   Medical Management of Chronic Issues    1) positive depression screen:  anhedonia , social avoidance,  aggravated by diarrhea that has been occurring 2-3 tims per seek ever since his gall bladder surgery  in 2023.  [facility]   2) post cholecystectomy diarrhrea,  2-3 times per week .  Discussed ozempic vs bile acide sequestants . Prefers     Outpatient Medications Prior to Visit  Medication Sig Dispense Refill   calcium carbonate (OS-CAL) 600 MG TABS Take 600 mg by mouth 2 (two) times daily with a meal.     cholecalciferol (VITAMIN D) 1000 UNITS tablet Take 3,000 Units by mouth daily.      EPINEPHrine 0.3 mg/0.3 mL IJ SOAJ injection INJECT 0.3 MLS (0.3 MG TOTAL) INTO THE MUSCLE ONCE FOR 1 DOSE.     hydrocortisone (ANUSOL-HC) 25 MG suppository Place 1 suppository (25 mg total) rectally 2 (two) times daily. 12 suppository 5   ketoconazole (NIZORAL) 2 % shampoo SHAMPOO FACE, SCALP, AND EARS TOPICALLY TWICE A WEEK. LET LATHER SIT FOR 2 3 MINS. BEFORE RINSING. 120 mL 2   levothyroxine (SYNTHROID) 88 MCG tablet TAKE 1 TABLET(88 MCG) BY MOUTH DAILY 90 tablet 1   metoprolol succinate (TOPROL-XL) 25 MG 24 hr tablet TAKE 1 TABLET(25 MG) BY MOUTH DAILY 90 tablet 3   Multiple Vitamin (MULTIVITAMINS PO) Take by mouth daily.     Multiple Vitamins-Minerals (ZINC PO) Take 1 tablet by mouth daily.     Probiotic Product (PROBIOTIC DAILY PO) Take by mouth daily.     tamsulosin (FLOMAX) 0.4 MG CAPS capsule TAKE 2 CAPSULES(0.8 MG) BY MOUTH DAILY 60 capsule 5   vitamin B-12 (CYANOCOBALAMIN) 100 MCG tablet Take 100 mcg by mouth daily.     No  facility-administered medications prior to visit.    Review of Systems;  Patient denies headache, fevers, malaise, unintentional weight loss, skin rash, eye pain, sinus congestion and sinus pain, sore throat, dysphagia,  hemoptysis , cough, dyspnea, wheezing, chest pain, palpitations, orthopnea, edema, abdominal pain, nausea, melena, diarrhea, constipation, flank pain, dysuria, hematuria, urinary  Frequency, nocturia, numbness, tingling, seizures,  Focal weakness, Loss of consciousness,  Tremor, insomnia, depression, anxiety, and suicidal ideation.      Objective:  BP 118/84   Pulse 91   Ht 6\' 4"  (1.93 m)   Wt 272 lb (123.4 kg)   SpO2 97%   BMI 33.11 kg/m   BP Readings from Last 3 Encounters:  03/20/23 118/84  03/04/23 122/85  02/03/23 114/75    Wt Readings from Last 3 Encounters:  03/20/23 272 lb (123.4 kg)  03/04/23 274 lb 6.4 oz (124.5 kg)  02/03/23 267 lb 12.8 oz (121.5 kg)    Physical Exam  Lab Results  Component Value Date   HGBA1C 5.1 05/27/2022   HGBA1C 5.0 03/08/2021   HGBA1C 5.2 07/01/2017    Lab Results  Component Value Date   CREATININE 0.70 05/27/2022   CREATININE 0.7 04/17/2022   CREATININE 0.76 03/08/2021    Lab Results  Component Value Date   WBC 6.6 05/27/2022   HGB 12.4 (L) 05/27/2022   HCT 37.6 (L) 05/27/2022  PLT 214.0 05/27/2022   GLUCOSE 73 05/27/2022   CHOL 204 (H) 05/27/2022   TRIG 150.0 (H) 05/27/2022   HDL 77.20 05/27/2022   LDLDIRECT 105.0 05/27/2022   LDLCALC 97 05/27/2022   ALT 18 05/27/2022   AST 26 05/27/2022   NA 138 05/27/2022   K 4.0 05/27/2022   CL 100 05/27/2022   CREATININE 0.70 05/27/2022   BUN 13 05/27/2022   CO2 30 05/27/2022   TSH 10.43 (H) 05/27/2022   PSA 0.64 05/27/2022   HGBA1C 5.1 05/27/2022   MICROALBUR <0.7 05/27/2022    No results found.  Assessment & Plan:  .Hypothyroidism (acquired)  Impaired fasting glucose  Dyslipidemia  Need for influenza vaccination     I provided 30 minutes  of face-to-face time during this encounter reviewing patient's last visit with me, patient's  most recent visit with cardiology,  nephrology,  and neurology,  recent surgical and non surgical procedures, previous  labs and imaging studies, counseling on currently addressed issues,  and post visit ordering to diagnostics and therapeutics .   Follow-up: No follow-ups on file.   Sherlene Shams, MD

## 2023-03-20 NOTE — Patient Instructions (Addendum)
Resume wellbutrin once dialy in the morning  Trial of colestipol to help resolve the post gallbadder surgery diarrhea.  Mix on packet in water once or twice daily   Referral to [facility] for counselling

## 2023-03-21 ENCOUNTER — Other Ambulatory Visit: Payer: Self-pay | Admitting: Internal Medicine

## 2023-03-21 DIAGNOSIS — F320 Major depressive disorder, single episode, mild: Secondary | ICD-10-CM | POA: Insufficient documentation

## 2023-03-21 DIAGNOSIS — K9089 Other intestinal malabsorption: Secondary | ICD-10-CM | POA: Insufficient documentation

## 2023-03-21 LAB — TSH: TSH: 7.88 m[IU]/L — ABNORMAL HIGH (ref 0.40–4.50)

## 2023-03-21 LAB — COMPLETE METABOLIC PANEL WITH GFR
AG Ratio: 1.6 (calc) (ref 1.0–2.5)
ALT: 34 U/L (ref 9–46)
AST: 44 U/L — ABNORMAL HIGH (ref 10–40)
Albumin: 4.1 g/dL (ref 3.6–5.1)
Alkaline phosphatase (APISO): 78 U/L (ref 36–130)
BUN: 18 mg/dL (ref 7–25)
CO2: 21 mmol/L (ref 20–32)
Calcium: 9 mg/dL (ref 8.6–10.3)
Chloride: 108 mmol/L (ref 98–110)
Creat: 0.81 mg/dL (ref 0.60–1.29)
Globulin: 2.6 g/dL (ref 1.9–3.7)
Glucose, Bld: 113 mg/dL — ABNORMAL HIGH (ref 65–99)
Potassium: 3.9 mmol/L (ref 3.5–5.3)
Sodium: 140 mmol/L (ref 135–146)
Total Bilirubin: 0.7 mg/dL (ref 0.2–1.2)
Total Protein: 6.7 g/dL (ref 6.1–8.1)
eGFR: 108 mL/min/{1.73_m2} (ref >=60)

## 2023-03-21 LAB — HEMOGLOBIN A1C
Hgb A1c MFr Bld: 5.1 %{Hb} (ref <5.7)
Mean Plasma Glucose: 100 mg/dL
eAG (mmol/L): 5.5 mmol/L

## 2023-03-21 LAB — LIPID PANEL
Cholesterol: 191 mg/dL (ref <200)
HDL: 65 mg/dL (ref >=40)
LDL Cholesterol (Calc): 101 mg/dL — ABNORMAL HIGH
Non-HDL Cholesterol (Calc): 126 mg/dL (ref <130)
Total CHOL/HDL Ratio: 2.9 (calc) (ref <5.0)
Triglycerides: 157 mg/dL — ABNORMAL HIGH (ref <150)

## 2023-03-21 LAB — LDL CHOLESTEROL, DIRECT: Direct LDL: 103 mg/dL — ABNORMAL HIGH (ref <100)

## 2023-03-21 LAB — MICROALBUMIN / CREATININE URINE RATIO
Creatinine, Urine: 168 mg/dL (ref 20–320)
Microalb Creat Ratio: 2 mg/g{creat} (ref <30)
Microalb, Ur: 0.3 mg/dL

## 2023-03-21 MED ORDER — LEVOTHYROXINE SODIUM 100 MCG PO TABS: 100.0000 ug | ORAL_TABLET | Freq: Every day | ORAL | 1 refills | Status: DC

## 2023-03-21 NOTE — Assessment & Plan Note (Signed)
Mild, with anhedonia and social isolation.    resume wellbutrin

## 2023-03-21 NOTE — Assessment & Plan Note (Signed)
He has no signs of diabetes by a1c    Lab Results  Component Value Date   HGBA1C 5.1 03/20/2023

## 2023-03-21 NOTE — Assessment & Plan Note (Signed)
Occurring 2-3 itmes weekly since his cholecystectomy over a year ago  .  Trial of colestipoll

## 2023-03-21 NOTE — Assessment & Plan Note (Signed)
INCREASE DOSE TO 100 MCG DAILY FOR ELEVATE D TSH  Lab Results  Component Value Date   TSH 7.88 (H) 03/20/2023

## 2023-04-23 ENCOUNTER — Other Ambulatory Visit: Payer: Self-pay

## 2023-04-23 MED ORDER — METOPROLOL SUCCINATE ER 25 MG PO TB24: 25.0000 mg | ORAL_TABLET | Freq: Every day | ORAL | 1 refills | Status: DC

## 2023-06-02 ENCOUNTER — Other Ambulatory Visit (INDEPENDENT_AMBULATORY_CARE_PROVIDER_SITE_OTHER): Payer: Managed Care, Other (non HMO)

## 2023-06-02 DIAGNOSIS — E538 Deficiency of other specified B group vitamins: Secondary | ICD-10-CM | POA: Diagnosis not present

## 2023-06-02 DIAGNOSIS — E559 Vitamin D deficiency, unspecified: Secondary | ICD-10-CM | POA: Diagnosis not present

## 2023-06-02 DIAGNOSIS — R7301 Impaired fasting glucose: Secondary | ICD-10-CM

## 2023-06-02 DIAGNOSIS — Z125 Encounter for screening for malignant neoplasm of prostate: Secondary | ICD-10-CM | POA: Diagnosis not present

## 2023-06-02 LAB — VITAMIN D 25 HYDROXY (VIT D DEFICIENCY, FRACTURES): VITD: 20.61 ng/mL — ABNORMAL LOW (ref 30.00–100.00)

## 2023-06-02 LAB — COMPREHENSIVE METABOLIC PANEL
ALT: 30 U/L (ref 0–53)
AST: 33 U/L (ref 0–37)
Albumin: 4 g/dL (ref 3.5–5.2)
Alkaline Phosphatase: 71 U/L (ref 39–117)
BUN: 16 mg/dL (ref 6–23)
CO2: 30 meq/L (ref 19–32)
Calcium: 8.8 mg/dL (ref 8.4–10.5)
Chloride: 102 meq/L (ref 96–112)
Creatinine, Ser: 0.75 mg/dL (ref 0.40–1.50)
GFR: 106.1 mL/min (ref >60.00)
Glucose, Bld: 87 mg/dL (ref 70–99)
Potassium: 4 meq/L (ref 3.5–5.1)
Sodium: 139 meq/L (ref 135–145)
Total Bilirubin: 1.3 mg/dL — ABNORMAL HIGH (ref 0.2–1.2)
Total Protein: 6.4 g/dL (ref 6.0–8.3)

## 2023-06-02 LAB — B12 AND FOLATE PANEL
Folate: 21.4 ng/mL (ref >5.9)
Vitamin B-12: 660 pg/mL (ref 211–911)

## 2023-06-02 LAB — PSA: PSA: 0.7 ng/mL (ref 0.10–4.00)

## 2023-06-08 ENCOUNTER — Ambulatory Visit: Payer: Managed Care, Other (non HMO) | Admitting: Internal Medicine

## 2023-06-08 VITALS — BP 124/86 | HR 78 | Ht 76.0 in | Wt 282.0 lb

## 2023-06-08 DIAGNOSIS — E538 Deficiency of other specified B group vitamins: Secondary | ICD-10-CM

## 2023-06-08 DIAGNOSIS — Z9049 Acquired absence of other specified parts of digestive tract: Secondary | ICD-10-CM | POA: Diagnosis not present

## 2023-06-08 DIAGNOSIS — M2142 Flat foot [pes planus] (acquired), left foot: Secondary | ICD-10-CM

## 2023-06-08 DIAGNOSIS — K9089 Other intestinal malabsorption: Secondary | ICD-10-CM

## 2023-06-08 DIAGNOSIS — F320 Major depressive disorder, single episode, mild: Secondary | ICD-10-CM

## 2023-06-08 DIAGNOSIS — E66811 Obesity, class 1: Secondary | ICD-10-CM

## 2023-06-08 DIAGNOSIS — E6609 Other obesity due to excess calories: Secondary | ICD-10-CM

## 2023-06-08 DIAGNOSIS — R5383 Other fatigue: Secondary | ICD-10-CM | POA: Diagnosis not present

## 2023-06-08 DIAGNOSIS — L219 Seborrheic dermatitis, unspecified: Secondary | ICD-10-CM | POA: Diagnosis not present

## 2023-06-08 DIAGNOSIS — Z0000 Encounter for general adult medical examination without abnormal findings: Secondary | ICD-10-CM

## 2023-06-08 DIAGNOSIS — Z6833 Body mass index (BMI) 33.0-33.9, adult: Secondary | ICD-10-CM

## 2023-06-08 DIAGNOSIS — Z1211 Encounter for screening for malignant neoplasm of colon: Secondary | ICD-10-CM | POA: Insufficient documentation

## 2023-06-08 DIAGNOSIS — D649 Anemia, unspecified: Secondary | ICD-10-CM

## 2023-06-08 DIAGNOSIS — M2141 Flat foot [pes planus] (acquired), right foot: Secondary | ICD-10-CM

## 2023-06-08 DIAGNOSIS — G4733 Obstructive sleep apnea (adult) (pediatric): Secondary | ICD-10-CM | POA: Diagnosis not present

## 2023-06-08 DIAGNOSIS — R7301 Impaired fasting glucose: Secondary | ICD-10-CM

## 2023-06-08 DIAGNOSIS — F520 Hypoactive sexual desire disorder: Secondary | ICD-10-CM | POA: Diagnosis not present

## 2023-06-08 DIAGNOSIS — E039 Hypothyroidism, unspecified: Secondary | ICD-10-CM

## 2023-06-08 MED ORDER — TAMSULOSIN HCL 0.4 MG PO CAPS: ORAL_CAPSULE | ORAL | 5 refills | Status: DC

## 2023-06-08 MED ORDER — ERGOCALCIFEROL 1.25 MG (50000 UT) PO CAPS: 50000.0000 [IU] | ORAL_CAPSULE | ORAL | 0 refills | Status: DC

## 2023-06-08 MED ORDER — HYDROCORTISONE 2.5 % EX CREA: TOPICAL_CREAM | Freq: Two times a day (BID) | CUTANEOUS | 2 refills | Status: DC

## 2023-06-08 NOTE — Patient Instructions (Addendum)
Your labs look fine except for your low vitamin d I have sent a mega dose to take once a week for the next 3 months to your pharmacy    Please schedule a lab visit for an early morning testosterone level   If the levels are normal please consider repeating your sleep study   Hydrocortisone cream sent to pharmacy  = Hoping you have a wonderful and blessed Christmas season,

## 2023-06-08 NOTE — Assessment & Plan Note (Addendum)
Diagnosed by prior sleep study but never treated per patient preference .  Reminded of the risks and ramifications of moderate to severe obstructive sleep apnea o which include  congestive heart failure, stroke, difficult to control hypertension, arrhythmias, uncontrolled type 2 diabetes, disruption of sleep and sleep deprivation in most cases, which, in turn, can cause recurrent headaches, problems with memory, mood, concentration, focus, and vigilance.

## 2023-06-08 NOTE — Progress Notes (Signed)
Patient ID: Chase Carey, male    DOB: [birth year]  Age: 47 y.o. MRN: [id number]  The patient is here for annual preventive examination and management of other chronic and acute problems.   The risk factors are reflected in the social history.   The roster of all physicians providing medical care to patient - is listed in the Snapshot section of the chart.   Activities of daily living:  The patient is 100% independent in all ADLs: dressing, toileting, feeding as well as independent mobility   Home safety : The patient has smoke detectors in the home. They wear seatbelts.  There are no unsecured firearms at home. There is no violence in the home.    There is no risks for hepatitis, STDs or HIV. There is no   history of blood transfusion. They have no travel history to infectious disease endemic areas of the world.   The patient has seen their dentist in the last six month. They have seen their eye doctor in the last year. The patinet  denies slight hearing difficulty with regard to whispered voices and some television programs.  They have deferred audiologic testing in the last year.  They do not  have excessive sun exposure. Discussed the need for sun protection: hats, long sleeves and use of sunscreen if there is significant sun exposure.    Diet: the importance of a healthy diet is discussed. They do have a healthy diet.   The benefits of regular aerobic exercise were discussed. The patient  exercises  3 to 5 days per week  for  60 minutes using a combined regiemn of aerobic exercise and weight lifting .    Depression screen: there are no signs or vegative symptoms of depression- irritability, change in appetite, anhedonia, sadness/tearfullness.   The following portions of the patient's history were reviewed and updated as appropriate: allergies, current medications, past family history, past medical history,  past surgical history, past social history  and problem list.   Visual acuity was  not assessed per patient preference since the patient has regular follow up with an  ophthalmologist. Hearing and body mass index were assessed and reviewed.    During the course of the visit the patient was educated and counseled about appropriate screening and preventive services including : fall prevention , diabetes screening, nutrition counseling, colorectal cancer screening, and recommended immunizations.    Chief Complaint:   1) symptoms attributed to low testosterone: lack of energy,  low sex drive,   but no  ED .  He is working out and Engineer, agricultural.  No sleep issues on a regular basis.  Sometimes wakes up early and has trouble returning  to sleep ,  but not habitually.  Does not used CPAP despite positive sleep study several years agp. .  No nocturia.   2) Pes planus:  he is requesting a  referral [facility].  He has  used Ortotics from prior podiatry referrals which havenot been effective I'm relieving his foot pain. (Never from t [facility] )  his painful condition is aggravated by his occupation because he walks around all day on a cement floor.      3) occasional diarrhea since his cholecystectomy .  Usually once or twice in the morning if at all.  Using colestipol.    Review of Symptoms  Patient denies headache, fevers, malaise, unintentional weight loss, skin rash, eye pain, sinus congestion and sinus pain, sore throat, dysphagia,  hemoptysis , cough, dyspnea, wheezing, chest pain, palpitations, orthopnea, edema, abdominal pain, nausea, melena, , constipation, flank pain, dysuria, hematuria, urinary  Frequency, nocturia, numbness, tingling, seizures,  Focal weakness, Loss of consciousness,  Tremor, insomnia, depression, anxiety, and suicidal ideation.    Physical Exam:  BP 124/86   Pulse 78   Ht 6\' 4"  (1.93 m)   Wt 282 lb (127.9 kg)   SpO2 99%   BMI 34.33 kg/m    Physical Exam Vitals reviewed.  Constitutional:      General: He is not in acute  distress.    Appearance: Normal appearance. He is obese. He is not ill-appearing, toxic-appearing or diaphoretic.  HENT:     Head: Normocephalic and atraumatic.     Right Ear: Tympanic membrane, ear canal and external ear normal. There is no impacted cerumen.     Left Ear: Tympanic membrane, ear canal and external ear normal. There is no impacted cerumen.     Nose: Nose normal.     Mouth/Throat:     Mouth: Mucous membranes are moist.     Pharynx: Oropharynx is clear.  Eyes:     General: No scleral icterus.       Right eye: No discharge.        Left eye: No discharge.     Conjunctiva/sclera: Conjunctivae normal.  Neck:     Thyroid: No thyromegaly.     Vascular: No carotid bruit or JVD.  Cardiovascular:     Rate and Rhythm: Normal rate and regular rhythm.     Pulses:          Dorsalis pedis pulses are 2+ on the right side and 2+ on the left side.     Heart sounds: Normal heart sounds.  Pulmonary:     Effort: Pulmonary effort is normal. No respiratory distress.     Breath sounds: Normal breath sounds.  Abdominal:     General: Bowel sounds are normal.     Palpations: Abdomen is soft. There is no mass.     Tenderness: There is no abdominal tenderness. There is no guarding or rebound.  Musculoskeletal:        General: Normal range of motion.     Cervical back: Normal range of motion and neck supple.     Right foot: Deformity present.     Left foot: Deformity present.  Feet:     Right foot:     Skin integrity: Skin integrity normal.     Left foot:     Skin integrity: Skin integrity normal.     Comments: Bilateral pes planus,  severe  Lymphadenopathy:     Cervical: No cervical adenopathy.  Skin:    General: Skin is warm and dry.  Neurological:     General: No focal deficit present.     Mental Status: He is alert and oriented to person, place, and time. Mental status is at baseline.  Psychiatric:        Mood and Affect: Mood normal.        Behavior: Behavior normal.         Thought Content: Thought content normal.        Judgment: Judgment normal.    Assessment and Plan: Colon cancer screening  Other fatigue -     Iron, TIBC and Ferritin Panel; Future  Lack of libido -     Testosterone,Free and Total; Future  Seborrheic dermatitis Assessment & Plan: Affecting scalp and fiace.  Diagnosedi  in  2019  by Jamelle [facility]  Using nizoral shampoo for breard  (patient is bald)   and prn hydrocortisone 2,5% cream     S/P laparoscopic cholecystectomy Assessment & Plan: Has intermittent  diarrhea has not changed in frequency with trial of colestipol .  He has meal realted diarrhea every 3 days or so    OSA (obstructive sleep apnea) Assessment & Plan: Diagnosed by prior sleep study but never treated per patient preference .  Reminded of the risks and ramifications of moderate to severe obstructive sleep apnea o which include  congestive heart failure, stroke, difficult to control hypertension, arrhythmias, uncontrolled type 2 diabetes, disruption of sleep and sleep deprivation in most cases, which, in turn, can cause recurrent headaches, problems with memory, mood, concentration, focus, and vigilance.    Bile salt-induced diarrhea Assessment & Plan: Occurring 2-3 itmes weekly since his cholecystectomy over a year ago  .  Advised to try taking colestipol at bedtime or early morning PRIOR to eating    B12 deficiency Assessment & Plan: Normalzied with supplementation. Continue supplementing given history of gasgric bypass    Anemia, unspecified type Assessment & Plan: Mild but Persistent.  Likely secondary to surgical losses vs decreased absorption of iron due to bariatric surgery  . Diagnostic EGD and colonoscopy jan 2021 at [facility] revealed no ulcers or masses. Will repeat CBC/iron with next blood draw   Lab Results  Component Value Date   WBC 6.6 05/27/2022   HGB 12.4 (L) 05/27/2022   HCT 37.6 (L) 05/27/2022   MCV 97.5 05/27/2022   PLT 214.0 05/27/2022      Orders: -     CBC with Differential/Platelet; Future  Depression, major, single episode, mild (HCC) Assessment & Plan: Mild, with anhedonia and social isolation.   Improved with  wellbutrin    Hypothyroidism (acquired) Assessment & Plan: Dose was increased to 100 MCG DAILY FOR ELEVATED tsh IN SEPT 2024,    RepEat TSH is needed  E Lab Results  Component Value Date   TSH 7.88 (H) 03/20/2023     Orders: -     TSH; Future  Impaired fasting glucose Assessment & Plan: He has no signs of diabetes by a1c    Lab Results  Component Value Date   HGBA1C 5.1 03/20/2023      Class 1 obesity due to excess calories without serious comorbidity with body mass index (BMI) of 33.0 to 33.9 in adult Assessment & Plan: History of bariatric surgery in 2011 with partial regain of weight. I have addressed  BMI and recommended wt loss of 10% of body weight over the next 6 months using a low fat, fruit/vegetable based Mediteranean diet and CONTINUED participation ion regular exercise a minimum of 5 days per week.     Pes planus of both feet Assessment & Plan: Referral to [facility] for orthotics in progress    Encounter for preventive health examination Assessment & Plan: age appropriate education and counseling updated, referrals for preventative services and immunizations addressed, dietary and smoking counseling addressed, most recent labs reviewed.  I have personally reviewed and have noted:   1) the patient's medical and social history 2) The pt's use of alcohol, tobacco, and illicit drugs 3) The patient's current medications and supplements 4) Functional ability including ADL's, fall risk, home safety risk, hearing and visual impairment 5) Diet and physical activities 6) Evidence for depression or mood disorder 7) The patient's height, weight, and BMI have been recorded in the chart\   I have  made referrals, and provided counseling and education based on review of the  above    Other orders -     Tamsulosin HCl; TAKE 2 CAPSULES(0.8 MG) BY MOUTH DAILY  Dispense: 60 capsule; Refill: 5 -     Hydrocortisone; Apply topically 2 (two) times daily. As needed for dermatitis  Dispense: 28 g; Refill: 2 -     Ergocalciferol; Take 1 capsule (50,000 Units total) by mouth once a week.  Dispense: 12 capsule; Refill: 0 -     buPROPion HCl ER (XL); Take 1 tablet (150 mg total) by mouth daily.  Dispense: 90 tablet; Refill: 1    Return in about 3 months (around 09/06/2023).  Sherlene Shams, MD

## 2023-06-08 NOTE — Assessment & Plan Note (Signed)
Affecting scalp and fiace.  Diagnosedi  in  2019  by [facility]  Using nizoral shampoo for breard  (patient is bald)   and prn hydrocortisone 2,5% cream

## 2023-06-08 NOTE — Assessment & Plan Note (Signed)
Has intermittent  diarrhea has not changed in frequency with trial of colestipol .  He has meal realted diarrhea every 3 days or so

## 2023-06-09 DIAGNOSIS — M2142 Flat foot [pes planus] (acquired), left foot: Secondary | ICD-10-CM | POA: Insufficient documentation

## 2023-06-09 MED ORDER — BUPROPION HCL ER (XL) 150 MG PO TB24: 150.0000 mg | ORAL_TABLET | Freq: Every day | ORAL | 1 refills | Status: AC

## 2023-06-09 NOTE — Assessment & Plan Note (Signed)
History of bariatric surgery in 2011 with partial regain of weight. I have addressed  BMI and recommended wt loss of 10% of body weight over the next 6 months using a low fat, fruit/vegetable based Mediteranean diet and CONTINUED participation ion regular exercise a minimum of 5 days per week.

## 2023-06-09 NOTE — Assessment & Plan Note (Signed)
Dose was increased to 100 MCG DAILY FOR ELEVATED tsh IN SEPT 2024,    RepEat TSH is needed  E Lab Results  Component Value Date   TSH 7.88 (H) 03/20/2023

## 2023-06-09 NOTE — Assessment & Plan Note (Signed)
He has no signs of diabetes by a1c    Lab Results  Component Value Date   HGBA1C 5.1 03/20/2023

## 2023-06-09 NOTE — Assessment & Plan Note (Signed)
Mild, with anhedonia and social isolation.   Improved with  wellbutrin

## 2023-06-09 NOTE — Assessment & Plan Note (Addendum)
Mild but Persistent.  Likely secondary to surgical losses vs decreased absorption of iron due to bariatric surgery  . Diagnostic EGD and colonoscopy jan 2021 at [facility] revealed no ulcers or masses. Will repeat CBC/iron with next blood draw   Lab Results  Component Value Date   WBC 6.6 05/27/2022   HGB 12.4 (L) 05/27/2022   HCT 37.6 (L) 05/27/2022   MCV 97.5 05/27/2022   PLT 214.0 05/27/2022

## 2023-06-09 NOTE — Assessment & Plan Note (Addendum)
Occurring 2-3 itmes weekly since his cholecystectomy over a year ago  .  Advised to try taking colestipol at bedtime or early morning PRIOR to eating

## 2023-06-09 NOTE — Assessment & Plan Note (Signed)
Referral to [facility] for orthotics in progress

## 2023-06-09 NOTE — Assessment & Plan Note (Signed)
Normalzied with supplementation. Continue supplementing given history of gasgric bypass

## 2023-06-09 NOTE — Assessment & Plan Note (Signed)

## 2023-06-19 ENCOUNTER — Ambulatory Visit: Payer: Managed Care, Other (non HMO) | Admitting: Internal Medicine

## 2023-06-22 ENCOUNTER — Other Ambulatory Visit: Payer: Self-pay | Admitting: Internal Medicine

## 2023-06-22 ENCOUNTER — Encounter (INDEPENDENT_AMBULATORY_CARE_PROVIDER_SITE_OTHER): Payer: Self-pay

## 2023-06-22 ENCOUNTER — Encounter: Payer: Self-pay | Admitting: Internal Medicine

## 2023-06-22 DIAGNOSIS — M2141 Flat foot [pes planus] (acquired), right foot: Secondary | ICD-10-CM

## 2023-06-23 NOTE — Telephone Encounter (Signed)
 Order has been faxed

## 2023-06-24 ENCOUNTER — Encounter: Payer: Self-pay | Admitting: Internal Medicine

## 2023-06-25 MED ORDER — HYDROCORTISONE ACETATE 25 MG RE SUPP: 25.0000 mg | Freq: Two times a day (BID) | RECTAL | 5 refills | Status: AC

## 2023-07-02 ENCOUNTER — Encounter: Payer: Self-pay | Admitting: Internal Medicine

## 2023-07-05 MED ORDER — HYDROCORTISONE 2.5 % EX CREA: TOPICAL_CREAM | Freq: Two times a day (BID) | CUTANEOUS | 2 refills | Status: AC

## 2023-09-11 ENCOUNTER — Ambulatory Visit (INDEPENDENT_AMBULATORY_CARE_PROVIDER_SITE_OTHER): Payer: Managed Care, Other (non HMO) | Admitting: Vascular Surgery

## 2023-09-11 ENCOUNTER — Ambulatory Visit: Payer: Managed Care, Other (non HMO) | Admitting: Internal Medicine

## 2023-09-11 ENCOUNTER — Encounter: Payer: Self-pay | Admitting: Internal Medicine

## 2023-09-11 ENCOUNTER — Encounter (INDEPENDENT_AMBULATORY_CARE_PROVIDER_SITE_OTHER): Payer: Self-pay | Admitting: Vascular Surgery

## 2023-09-11 VITALS — BP 136/88 | HR 78 | Resp 16 | Ht 76.0 in | Wt 285.4 lb

## 2023-09-11 VITALS — BP 130/86 | HR 85 | Resp 18 | Wt 284.0 lb

## 2023-09-11 DIAGNOSIS — D518 Other vitamin B12 deficiency anemias: Secondary | ICD-10-CM

## 2023-09-11 DIAGNOSIS — R7401 Elevation of levels of liver transaminase levels: Secondary | ICD-10-CM | POA: Diagnosis not present

## 2023-09-11 DIAGNOSIS — K562 Volvulus: Secondary | ICD-10-CM

## 2023-09-11 DIAGNOSIS — E538 Deficiency of other specified B group vitamins: Secondary | ICD-10-CM

## 2023-09-11 DIAGNOSIS — I10 Essential (primary) hypertension: Secondary | ICD-10-CM

## 2023-09-11 DIAGNOSIS — F320 Major depressive disorder, single episode, mild: Secondary | ICD-10-CM | POA: Diagnosis not present

## 2023-09-11 DIAGNOSIS — I83813 Varicose veins of bilateral lower extremities with pain: Secondary | ICD-10-CM | POA: Diagnosis not present

## 2023-09-11 LAB — COMPREHENSIVE METABOLIC PANEL
ALT: 19 U/L (ref 0–53)
AST: 26 U/L (ref 0–37)
Albumin: 4.2 g/dL (ref 3.5–5.2)
Alkaline Phosphatase: 68 U/L (ref 39–117)
BUN: 16 mg/dL (ref 6–23)
CO2: 28 meq/L (ref 19–32)
Calcium: 9.3 mg/dL (ref 8.4–10.5)
Chloride: 103 meq/L (ref 96–112)
Creatinine, Ser: 0.77 mg/dL (ref 0.40–1.50)
GFR: 105.06 mL/min (ref >60.00)
Glucose, Bld: 89 mg/dL (ref 70–99)
Potassium: 4.1 meq/L (ref 3.5–5.1)
Sodium: 139 meq/L (ref 135–145)
Total Bilirubin: 0.9 mg/dL (ref 0.2–1.2)
Total Protein: 6.8 g/dL (ref 6.0–8.3)

## 2023-09-11 LAB — CBC WITH DIFFERENTIAL/PLATELET
Basophils Absolute: 0 10*3/uL (ref 0.0–0.1)
Basophils Relative: 0.2 % (ref 0.0–3.0)
Eosinophils Absolute: 0.1 10*3/uL (ref 0.0–0.7)
Eosinophils Relative: 1.6 % (ref 0.0–5.0)
HCT: 39.2 % (ref 39.0–52.0)
Hemoglobin: 12.8 g/dL — ABNORMAL LOW (ref 13.0–17.0)
Lymphocytes Relative: 22.4 % (ref 12.0–46.0)
Lymphs Abs: 1.9 10*3/uL (ref 0.7–4.0)
MCHC: 32.7 g/dL (ref 30.0–36.0)
MCV: 100 fl (ref 78.0–100.0)
Monocytes Absolute: 0.5 10*3/uL (ref 0.1–1.0)
Monocytes Relative: 6.3 % (ref 3.0–12.0)
Neutro Abs: 5.8 10*3/uL (ref 1.4–7.7)
Neutrophils Relative %: 69.5 % (ref 43.0–77.0)
Platelets: 211 10*3/uL (ref 150.0–400.0)
RBC: 3.92 Mil/uL — ABNORMAL LOW (ref 4.22–5.81)
RDW: 13.5 % (ref 11.5–15.5)
WBC: 8.3 10*3/uL (ref 4.0–10.5)

## 2023-09-11 MED ORDER — ERGOCALCIFEROL 1.25 MG (50000 UT) PO CAPS: 50000.0000 [IU] | ORAL_CAPSULE | ORAL | 0 refills | Status: DC

## 2023-09-11 MED ORDER — HYDROCHLOROTHIAZIDE 12.5 MG PO CAPS: 12.5000 mg | ORAL_CAPSULE | Freq: Every day | ORAL | 1 refills | Status: AC

## 2023-09-11 NOTE — Assessment & Plan Note (Signed)
 Normalized  with oral supplementation. Continue supplementing given history of gastric bypass

## 2023-09-11 NOTE — Progress Notes (Signed)
 Chase Carey is a 48 y.o.male who presents with painful varicose veins of the right leg  Past Medical History:  Diagnosis Date   Anal fissure    Anemia, unspecified    Arthritis    ankle   Asymptomatic varicose veins    Balanoposthitis    Cervicalgia    Chronic headaches    Elevated blood pressure reading without diagnosis of hypertension    Gallstones    GERD (gastroesophageal reflux disease)    Localized superficial swelling, mass, or lump    Lumbago    Obesity, unspecified    Other abnormal blood chemistry    Other dyspnea and respiratory abnormality    Plantar fascial fibromatosis    Psychogenic pain, site unspecified    Seborrhea capitis    Sleep apnea    mild   Sprain of neck    Tuberculosis    positive PPD; negative chest x ray- 2000, took medication for 6 months    Past Surgical History:  Procedure Laterality Date   GASTRIC BYPASS  10/17/2009   HEMORRHOIDECTOMY WITH HEMORRHOID BANDING  2011   HERNIA REPAIR  03/2016   internal at [facility]   RLE varicose vein laser procedure  2011   [facility]   ROTATOR CUFF REPAIR Right 09/2020   VASECTOMY  02/25/2013   no report of complications   VEIN LIGATION AND STRIPPING Right 04/07/2013   leg    Current Outpatient Medications  Medication Sig Dispense Refill   buPROPion (WELLBUTRIN XL) 150 MG 24 hr tablet Take 1 tablet (150 mg total) by mouth daily. 90 tablet 1   calcium carbonate (OS-CAL) 600 MG TABS Take 600 mg by mouth 2 (two) times daily with a meal.     cholecalciferol (VITAMIN D) 1000 UNITS tablet Take 3,000 Units by mouth daily.      colestipol (COLESTID) 5 g granules Take 5 g by mouth 2 (two) times daily. 500 g 12   EPINEPHrine 0.3 mg/0.3 mL IJ SOAJ injection Inject 0.3 mg into the muscle as needed.     ergocalciferol (DRISDOL) 1.25 MG (50000 UT) capsule Take 1 capsule (50,000 Units total) by mouth once a week. 12 capsule 0   hydrochlorothiazide (MICROZIDE) 12.5 MG capsule Take 1 capsule (12.5 mg total) by mouth  daily. 90 capsule 1   hydrocortisone (ANUSOL-HC) 25 MG suppository Place 1 suppository (25 mg total) rectally 2 (two) times daily. 12 suppository 5   hydrocortisone 2.5 % cream Apply topically 2 (two) times daily. As needed for dermatitis or hemorrhoids 28 g 2   ketoconazole (NIZORAL) 2 % shampoo SHAMPOO FACE, SCALP, AND EARS TOPICALLY TWICE A WEEK. LET LATHER SIT FOR 2 3 MINS. BEFORE RINSING. 120 mL 2   levothyroxine (SYNTHROID) 100 MCG tablet Take 1 tablet (100 mcg total) by mouth daily before breakfast. 90 tablet 1   metoprolol succinate (TOPROL-XL) 25 MG 24 hr tablet Take 1 tablet (25 mg total) by mouth daily. 90 tablet 1   Multiple Vitamin (MULTIVITAMINS PO) Take by mouth daily.     Multiple Vitamins-Minerals (ZINC PO) Take 1 tablet by mouth daily.     Probiotic Product (PROBIOTIC DAILY PO) Take by mouth daily.     tamsulosin (FLOMAX) 0.4 MG CAPS capsule TAKE 2 CAPSULES(0.8 MG) BY MOUTH DAILY 60 capsule 5   vitamin B-12 (CYANOCOBALAMIN) 100 MCG tablet Take 100 mcg by mouth daily.     No current facility-administered medications for this visit.    Allergies  Allergen Reactions   Shrimp Extract  Anaphylaxis    Shortness of breath. Shortness of breath. Shortness of breath.    Shrimp [Shellfish Allergy] Anaphylaxis    Shortness of breath. Shortness of breath.   Nsaids Other (See Comments)    History of RYGB. Ulcergenic    Indication: Patient presents with symptomatic varicose veins of the right lower extremity.  Procedure: Foam sclerotherapy was performed on the right lower extremity. Using ultrasound guidance, 5 mL of foam Sotradecol was used to inject the varicosities of the right lower extremity. Compression wraps were placed. The patient tolerated the procedure well.

## 2023-09-11 NOTE — Assessment & Plan Note (Signed)
 Mood has been "up and down" ,  impacted by weather,  continue 150 mg wellbutrin for now

## 2023-09-11 NOTE — Assessment & Plan Note (Addendum)
 Not  at goal.  adding hct 12.5  mg

## 2023-09-11 NOTE — Telephone Encounter (Signed)
 Pt requesting refill for epi pen

## 2023-09-11 NOTE — Patient Instructions (Addendum)
 I am Adding hydrochlorothiazide 12.5 mg daily to lower your diastolic pressure (the bottom number )   Goal blood pressure is  120/70 to 130/80 to reduce risks of stroke, MI,  and kidney failure  I have refilled the vitamin D,and  the epi pen   Keep the surgical incision that is still "open" covered to prevent a skin infection from yeast and bacteria

## 2023-09-11 NOTE — Progress Notes (Signed)
 Subjective:  Patient ID: Chase Carey, male    DOB: 1975/07/04  Age: 48 y.o. MRN: [id number]  CC: The primary encounter diagnosis was Other vitamin B12 deficiency anemia. Diagnoses of Elevated AST (SGOT), B12 deficiency, Depression, major, single episode, mild (HCC), Essential hypertension, and Intestinal volvulus (HCC) were also pertinent to this visit.   HPI Chase Carey presents for  Chief Complaint  Patient presents with   Medical Management of Chronic Issues    Recent hospitalization for small bowel volvulus ,  transferred to [facility] for emergent surgery and underwent laparascopic surgery  on March 7.  Follows up with gen sur on march 25   Woke up March 7 with  abd pain .  Unable to eat or drink,  Drinking water caused pain and dry heaves.  Went to work anyway,  and then presented to [facility] after work ,  MRI done.  Transferred by EMS to [facility] midnight  surgery that night ,  discharged home on Saturday  BEFORE HE had a BM  (flatus , yes). Bowels moved on Sunday or Monday.  Some incisional site pain but no nausea .  Appetite fine.  D/c instructions no lifting > 15 lbs for 6 weeks ,  given rx for muscle relaxers and opioids .  Ho longer taking the opioids   This is his 7th abd surgery for various intestinal issues.  (Hernias ,  several  )  a lot of scar tissue .    Hypertension: patient checks blood pressure twice weekly at home.  Readings have been for the most part >130/80 at rest . Patient is following a reduced salt diet most days and is taking medications as prescribed   Lab Results  Component Value Date   VITAMINB12 660 06/02/2023       Outpatient Medications Prior to Visit  Medication Sig Dispense Refill   buPROPion (WELLBUTRIN XL) 150 MG 24 hr tablet Take 1 tablet (150 mg total) by mouth daily. 90 tablet 1   calcium carbonate (OS-CAL) 600 MG TABS Take 600 mg by mouth 2 (two) times daily with a meal.     cholecalciferol (VITAMIN D) 1000 UNITS tablet  Take 3,000 Units by mouth daily.      colestipol (COLESTID) 5 g granules Take 5 g by mouth 2 (two) times daily. 500 g 12   EPINEPHrine 0.3 mg/0.3 mL IJ SOAJ injection Inject 0.3 mg into the muscle as needed.     hydrocortisone (ANUSOL-HC) 25 MG suppository Place 1 suppository (25 mg total) rectally 2 (two) times daily. 12 suppository 5   hydrocortisone 2.5 % cream Apply topically 2 (two) times daily. As needed for dermatitis or hemorrhoids 28 g 2   ketoconazole (NIZORAL) 2 % shampoo SHAMPOO FACE, SCALP, AND EARS TOPICALLY TWICE A WEEK. LET LATHER SIT FOR 2 3 MINS. BEFORE RINSING. 120 mL 2   levothyroxine (SYNTHROID) 100 MCG tablet Take 1 tablet (100 mcg total) by mouth daily before breakfast. 90 tablet 1   metoprolol succinate (TOPROL-XL) 25 MG 24 hr tablet Take 1 tablet (25 mg total) by mouth daily. 90 tablet 1   Multiple Vitamin (MULTIVITAMINS PO) Take by mouth daily.     Multiple Vitamins-Minerals (ZINC PO) Take 1 tablet by mouth daily.     Probiotic Product (PROBIOTIC DAILY PO) Take by mouth daily.     tamsulosin (FLOMAX) 0.4 MG CAPS capsule TAKE 2 CAPSULES(0.8 MG) BY MOUTH DAILY 60 capsule 5   vitamin B-12 (CYANOCOBALAMIN) 100  MCG tablet Take 100 mcg by mouth daily.     ergocalciferol (DRISDOL) 1.25 MG (50000 UT) capsule Take 1 capsule (50,000 Units total) by mouth once a week. 12 capsule 0   No facility-administered medications prior to visit.    Review of Systems;  Patient denies headache, fevers, malaise, unintentional weight loss, skin rash, eye pain, sinus congestion and sinus pain, sore throat, dysphagia,  hemoptysis , cough, dyspnea, wheezing, chest pain, palpitations, orthopnea, edema, abdominal pain, nausea, melena, diarrhea, constipation, flank pain, dysuria, hematuria, urinary  Frequency, nocturia, numbness, tingling, seizures,  Focal weakness, Loss of consciousness,  Tremor, insomnia, depression, anxiety, and suicidal ideation.      Objective:  BP 136/88   Pulse 78   Resp  16   Ht 6\' 4"  (1.93 m)   Wt 285 lb 6.4 oz (129.5 kg)   SpO2 98%   BMI 34.74 kg/m   BP Readings from Last 3 Encounters:  09/11/23 130/86  09/11/23 136/88  06/08/23 124/86    Wt Readings from Last 3 Encounters:  09/11/23 284 lb (128.8 kg)  09/11/23 285 lb 6.4 oz (129.5 kg)  06/08/23 282 lb (127.9 kg)    Physical Exam Vitals reviewed.  Constitutional:      General: He is not in acute distress.    Appearance: Normal appearance. He is normal weight. He is not ill-appearing, toxic-appearing or diaphoretic.  HENT:     Head: Normocephalic.  Eyes:     General: No scleral icterus.       Right eye: No discharge.        Left eye: No discharge.     Conjunctiva/sclera: Conjunctivae normal.  Cardiovascular:     Rate and Rhythm: Normal rate and regular rhythm.     Heart sounds: Normal heart sounds.  Pulmonary:     Effort: Pulmonary effort is normal. No respiratory distress.     Breath sounds: Normal breath sounds.  Abdominal:     Palpations: Abdomen is soft.       Comments: Laparoscopic wound still open , exudative covered by breast pannus   Musculoskeletal:        General: Normal range of motion.     Cervical back: Normal range of motion.  Skin:    General: Skin is warm and dry.  Neurological:     General: No focal deficit present.     Mental Status: He is alert and oriented to person, place, and time. Mental status is at baseline.  Psychiatric:        Mood and Affect: Mood normal.        Behavior: Behavior normal.        Thought Content: Thought content normal.        Judgment: Judgment normal.    Lab Results  Component Value Date   HGBA1C 5.1 03/20/2023   HGBA1C 5.1 05/27/2022   HGBA1C 5.0 03/08/2021    Lab Results  Component Value Date   CREATININE 0.77 09/11/2023   CREATININE 0.75 06/02/2023   CREATININE 0.81 03/20/2023    Lab Results  Component Value Date   WBC 8.3 09/11/2023   HGB 12.8 (L) 09/11/2023   HCT 39.2 09/11/2023   PLT 211.0 09/11/2023    GLUCOSE 89 09/11/2023   CHOL 191 03/20/2023   TRIG 157 (H) 03/20/2023   HDL 65 03/20/2023   LDLDIRECT 103 (H) 03/20/2023   LDLCALC 101 (H) 03/20/2023   ALT 19 09/11/2023   AST 26 09/11/2023   NA 139 09/11/2023   K  4.1 09/11/2023   CL 103 09/11/2023   CREATININE 0.77 09/11/2023   BUN 16 09/11/2023   CO2 28 09/11/2023   TSH 7.88 (H) 03/20/2023   PSA 0.70 06/02/2023   HGBA1C 5.1 03/20/2023   MICROALBUR 0.3 03/20/2023    No results found.  Assessment & Plan:  .Other vitamin B12 deficiency anemia Assessment & Plan: Hgb dropped 1 pt during surgery March 2025,  repeating today   Orders: -     CBC with Differential/Platelet  Elevated AST (SGOT) -     Comprehensive metabolic panel  B12 deficiency Assessment & Plan: Normalized  with oral supplementation. Continue supplementing given history of gastric bypass    Depression, major, single episode, mild (HCC) Assessment & Plan: Mood has been "up and Carey" ,  impacted by weather,  continue 150 mg wellbutrin for now    Essential hypertension Assessment & Plan: Not  at goal.  adding hct 12.5  mg    Intestinal volvulus (HCC) Assessment & Plan: Recent hospitalization  reviewed and  surgical wounds examined.  His volvulus was attributed to scar tissue from multiple surgeries and was reduced laparoscopically with resolution   Other orders -     hydroCHLOROthiazide; Take 1 capsule (12.5 mg total) by mouth daily.  Dispense: 90 capsule; Refill: 1 -     Ergocalciferol; Take 1 capsule (50,000 Units total) by mouth once a week.  Dispense: 12 capsule; Refill: 0     I spent 34 minutes on the day of this face to face encounter reviewing patient's  most recent visit hospitalization relevant and non surgical procedures, recent  labs and imaging studies, counseling on weight management,  reviewing the assessment and plan with patient, and post visit ordering and reviewing of  diagnostics and therapeutics with patient  .   Follow-up: No  follow-ups on file.   Sherlene Shams, MD

## 2023-09-11 NOTE — Assessment & Plan Note (Signed)
 Hgb dropped 1 pt during surgery March 2025,  repeating today

## 2023-09-13 DIAGNOSIS — K562 Volvulus: Secondary | ICD-10-CM | POA: Insufficient documentation

## 2023-09-13 NOTE — Assessment & Plan Note (Addendum)
 Recent hospitalization  reviewed and  surgical wounds examined.  His volvulus was attributed to scar tissue from multiple surgeries and was reduced laparoscopically with resolution

## 2023-09-14 ENCOUNTER — Encounter: Payer: Self-pay | Admitting: Internal Medicine

## 2023-09-14 MED ORDER — EPINEPHRINE 0.3 MG/0.3ML IJ SOAJ: 0.3000 mg | INTRAMUSCULAR | 2 refills | Status: AC | PRN

## 2023-09-25 ENCOUNTER — Other Ambulatory Visit: Payer: Self-pay | Admitting: Internal Medicine

## 2023-09-27 ENCOUNTER — Encounter: Payer: Self-pay | Admitting: Internal Medicine

## 2023-09-29 ENCOUNTER — Ambulatory Visit

## 2023-09-29 ENCOUNTER — Encounter: Payer: Self-pay | Admitting: Family Medicine

## 2023-09-29 ENCOUNTER — Ambulatory Visit: Admitting: Family Medicine

## 2023-09-29 ENCOUNTER — Ambulatory Visit: Payer: Self-pay

## 2023-09-29 VITALS — BP 110/72 | HR 87 | Temp 98.2°F | Resp 20 | Ht 76.0 in | Wt 285.4 lb

## 2023-09-29 DIAGNOSIS — S61311A Laceration without foreign body of left index finger with damage to nail, initial encounter: Secondary | ICD-10-CM

## 2023-09-29 NOTE — Progress Notes (Signed)
 SUBJECTIVE:   Chief Complaint  Patient presents with   Puncture Wound    Index left finger happened last week while cutting vegetables   HPI Presents for acute visit  Discussed the use of AI scribe software for clinical note transcription with the patient, who gave verbal consent to proceed.  History of Present Illness Chase Carey is a 48 year old male who presents with numbness and tightness in the left forefinger following a laceration.  Approximately ten days ago, he sustained a laceration to his left forefinger while chopping green onions with a chef's knife. The injury bled significantly for about thirty minutes before stopping. Despite his wife's suggestion, he did not seek immediate medical attention. Over time, the pain has subsided, but he experiences discomfort when the finger is bumped or hit against something.  He describes a numbness and tight sensation in the left forefinger, similar to the feeling when dental anesthesia begins to wear off, with intermittent numbness and a 'funny feeling.' No fever or discoloration in the finger. He is able to move the finger, although it feels tight when bending.  His tetanus vaccination is confirmed to be up to date.    PERTINENT PMH / PSH: As above  OBJECTIVE:  BP 110/72   Pulse 87   Temp 98.2 F (36.8 C)   Resp 20   Ht 6\' 4"  (1.93 m)   Wt 285 lb 6 oz (129.4 kg)   SpO2 99%   BMI 34.74 kg/m    Physical Exam Vitals reviewed.  Constitutional:      General: He is not in acute distress.    Appearance: Normal appearance. He is normal weight. He is not ill-appearing, toxic-appearing or diaphoretic.  Eyes:     General:        Right eye: No discharge.        Left eye: No discharge.  Cardiovascular:     Rate and Rhythm: Normal rate.  Pulmonary:     Effort: Pulmonary effort is normal.  Musculoskeletal:        General: Normal range of motion.     Cervical back: Normal range of motion.     Comments: Full ROM of left  index finger, no joint pain, swelling or erythema.  Decreased sensation along lateral aspect distally.  Skin:    General: Skin is warm and dry.     Findings: No erythema.  Neurological:     Mental Status: He is alert and oriented to person, place, and time. Mental status is at baseline.  Psychiatric:        Mood and Affect: Mood normal.        Behavior: Behavior normal.        Thought Content: Thought content normal.        Judgment: Judgment normal.             09/29/2023    2:45 PM 09/11/2023    8:41 AM 06/08/2023    2:59 PM 03/20/2023    4:11 PM 05/27/2022   11:11 AM  Depression screen PHQ 2/9  Decreased Interest 0 0 0 1 0  Down, Depressed, Hopeless 0 0 0 1 0  PHQ - 2 Score 0 0 0 2 0  Altered sleeping 0 0  0   Tired, decreased energy 0 0  0   Change in appetite 0 0  0   Feeling bad or failure about yourself  0 0  0   Trouble concentrating 0 0  1   Moving slowly or fidgety/restless 0 0  0   Suicidal thoughts 0 0  0   PHQ-9 Score 0 0  3   Difficult doing work/chores Not difficult at all Not difficult at all  Somewhat difficult       09/29/2023    2:45 PM 03/20/2023    4:11 PM 07/25/2019    8:53 AM  GAD 7 : Generalized Anxiety Score  Nervous, Anxious, on Edge 0 1 0  Control/stop worrying 0 0 0  Worry too much - different things 0 0 0  Trouble relaxing 0 0 0  Restless 0 0 0  Easily annoyed or irritable 0 1 0  Afraid - awful might happen 0 0 0  Total GAD 7 Score 0 2 0  Anxiety Difficulty Not difficult at all Somewhat difficult Not difficult at all    ASSESSMENT/PLAN:  Laceration of left index finger with damage to nail, foreign body presence unspecified, initial encounter Assessment & Plan: Laceration to left forefinger with possible nerve involvement. No infection or significant pain. Nerve regeneration likely. No tendon or bone damage.  - Refer to hand specialist for further evaluation. - Encourage continued movement of the finger to prevent  contracture.  Orders: -     DG Finger Index Left; Future -     Ambulatory referral to Hand Surgery    PDMP reviewed  Return if symptoms worsen or fail to improve, for PCP.  Valli Gaw, MD

## 2023-09-29 NOTE — Telephone Encounter (Signed)
 Copied from CRM [id number]. Topic: Clinical - Red Word Triage >> Sep 29, 2023  8:08 AM Chase Carey wrote: Chase Carey that prompted transfer to Nurse Triage: chopped into finger. Chopped into nail, not all the way through.  Chief Complaint: finger lac Symptoms: pain, numbness Frequency: happened last week Pertinent Negatives: Patient denies fever, Disposition: [] ED /[] Urgent Care (no appt availability in office) / [x] Appointment(In office/virtual)/ []  [facility]/ [] Home Care/ [] Refused Recommended Disposition /[] [facility]/ []  Follow-up with PCP Additional Notes: per protocol apt made; care advice given, denies questions; instructed to go to ER if becomes worse.   Reason for Disposition  [1] Last tetanus shot > 5 years ago AND [2] DIRTY cut or scrape  Answer Assessment - Initial Assessment Questions 1. MECHANISM: "How did the injury happen?"      Was cutting veggies, left index finger 2. ONSET: "When did the injury happen?" (Minutes or hours ago)      Last week 3. LOCATION: "What part of the finger is injured?" "Is the nail damaged?"      Nail damaged 4. APPEARANCE of the INJURY: "What does the injury look like?"      Open area  5. SEVERITY: "Can you use the hand normally?"  "Can you bend your fingers into a ball and then fully open them?"     numbness 6. SIZE: For cuts, bruises, or swelling, ask: "How large is it?" (e.g., inches or centimeters;  entire finger)      Top of finger nail down through the side of the finger 7. PAIN: "Is there pain?" If Yes, ask: "How bad is the pain?"    (e.g., Scale 1-10; or mild, moderate, severe)  - NONE (0): no pain.  - MILD (1-3): doesn't interfere with normal activities.   - MODERATE (4-7): interferes with normal activities or awakens from sleep.  - SEVERE (8-10): excruciating pain, unable to hold a glass of water or bend finger even a little.     Mild 8. TETANUS: For any breaks in the skin, ask: "When was the last tetanus  booster?"     Last one about 6 years ago 9. OTHER SYMPTOMS: "Do you have any other symptoms?"     denies 10. PREGNANCY: "Is there any chance you are pregnant?" "When was your last menstrual period?"       na  Protocols used: Finger Injury-A-AH

## 2023-09-29 NOTE — Patient Instructions (Signed)
 It was a pleasure meeting you today. Thank you for allowing me to take part in your health care.  Our goals for today as we discussed include:  Will xray left finger  Likely will take some time for feeling to return given injury  Keep clean and dry.    Continue to mobilize to decrease contracture  If no improvement recommend [facility] for evaluation   This is a list of the screening recommended for you and due dates:  Health Maintenance  Topic Date Due   COVID-19 Vaccine (6 - 2024-25 season) 06/22/2024*   Flu Shot  01/22/2024   Colon Cancer Screening  06/27/2030   DTaP/Tdap/Td vaccine (3 - Td or Tdap) 03/19/2033   Hepatitis C Screening  Completed   HIV Screening  Completed   HPV Vaccine  Aged Out  *Topic was postponed. The date shown is not the original due date.      If you have any questions or concerns, please do not hesitate to call the office at [phone].  I look forward to our next visit and until then take care and stay safe.  Regards,   Dana Allan, MD   [facility]

## 2023-10-11 ENCOUNTER — Encounter: Payer: Self-pay | Admitting: Family Medicine

## 2023-10-11 DIAGNOSIS — S61311A Laceration without foreign body of left index finger with damage to nail, initial encounter: Secondary | ICD-10-CM | POA: Insufficient documentation

## 2023-10-11 NOTE — Assessment & Plan Note (Signed)
 Laceration to left forefinger with possible nerve involvement. No infection or significant pain. Nerve regeneration likely. No tendon or bone damage.  - Refer to hand specialist for further evaluation. - Encourage continued movement of the finger to prevent contracture.

## 2023-11-10 ENCOUNTER — Encounter (INDEPENDENT_AMBULATORY_CARE_PROVIDER_SITE_OTHER): Payer: Self-pay

## 2024-01-08 ENCOUNTER — Other Ambulatory Visit: Payer: Self-pay | Admitting: Internal Medicine

## 2024-01-11 NOTE — Telephone Encounter (Signed)
 Refilled: 09/11/2023 Last OV: 09/29/2023 Next OV: not scheduled

## 2024-01-12 ENCOUNTER — Other Ambulatory Visit: Payer: Self-pay | Admitting: Internal Medicine

## 2024-01-12 NOTE — Telephone Encounter (Unsigned)
 Copied from CRM #[id number]. Topic: Clinical - Medication Refill >> Jan 12, 2024  3:33 PM Thersia C wrote: Medication: ergocalciferol  (DRISDOL ) 1.25 MG (50000 UT) capsule  Has the patient contacted their pharmacy? Yes (Agent: If no, request that the patient contact the pharmacy for the refill. If patient does not wish to contact the pharmacy document the reason why and proceed with request.) (Agent: If yes, when and what did the pharmacy advise?)  This is the patient's preferred pharmacy:  [id number] - [place], [place] - [address] AT [facility] [address] [place] [address] Phone: [phone] Fax: [phone]  Is this the correct pharmacy for this prescription? Yes If no, delete pharmacy and type the correct one.   Has the prescription been filled recently? No  Is the patient out of the medication? Yes  Has the patient been seen for an appointment in the last year OR does the patient have an upcoming appointment? Yes  Can we respond through MyChart? Yes  Agent: Please be advised that Rx refills may take up to 3 business days. We ask that you follow-up with your pharmacy.

## 2024-01-12 NOTE — Telephone Encounter (Signed)
 Last OV: 09/29/2023

## 2024-02-08 ENCOUNTER — Telehealth: Payer: Self-pay

## 2024-02-08 MED ORDER — TAMSULOSIN HCL 0.4 MG PO CAPS: ORAL_CAPSULE | ORAL | 5 refills | Status: AC

## 2024-02-08 MED ORDER — METOPROLOL SUCCINATE ER 25 MG PO TB24
25.0000 mg | ORAL_TABLET | Freq: Every day | ORAL | 1 refills | Status: AC
Start: 2024-02-08 — End: ?

## 2024-02-08 NOTE — Telephone Encounter (Signed)
 Medication has been refilled.

## 2024-02-08 NOTE — Addendum Note (Signed)
 Addended by: Olliver Boyadjian on: 02/08/2024 01:29 PM   Modules accepted: Orders

## 2024-02-08 NOTE — Telephone Encounter (Signed)
 Copied from CRM #[id number]. Topic: Clinical - Medication Refill >> Feb 08, 2024 10:53 AM Aleatha C wrote: Medication: tamsulosin  (FLOMAX ) 0.4 MG CAPS capsule, Metoprolol  er 25mg   Has the patient contacted their pharmacy? Yes (Agent: If no, request that the patient contact the pharmacy for the refill. If patient does not wish to contact the pharmacy document the reason why and proceed with request.) (Agent: If yes, when and what did the pharmacy advise?)  This is the patient's preferred pharmacy:  [id number] - [place], [place] - [address] AT [facility] [address] [facility] [address] [place] [address] Phone: [phone] Fax: [phone]  Is this the correct pharmacy for this prescription? Yes If no, delete pharmacy and type the correct one.   Has the prescription been filled recently? No  Is the patient out of the medication? Yes  Has the patient been seen for an appointment in the last year OR does the patient have an upcoming appointment? No  Can we respond through MyChart? No  Agent: Please be advised that Rx refills may take up to 3 business days. We ask that you follow-up with your pharmacy.

## 2024-05-16 ENCOUNTER — Other Ambulatory Visit: Payer: Self-pay | Admitting: Internal Medicine

## 2024-05-17 NOTE — Telephone Encounter (Signed)
 Last office visit 09/29/23 for Laceration of left index finger.  Last refilled 01/13/2024 for #12 with no refills.  Last Vit D level  09/15/23 at 26 ng/mL.  Next appt:  No future appointments

## 2024-06-10 ENCOUNTER — Other Ambulatory Visit: Payer: Self-pay | Admitting: Internal Medicine
